# Patient Record
Sex: Female | Born: 1945 | Race: White | Hispanic: No | Marital: Married | Smoking: Former smoker
Health system: Southern US, Community
[De-identification: ages and names within clinical notes are randomized; demographics above are authoritative.]

## PROBLEM LIST (undated history)

## (undated) DIAGNOSIS — J45909 Unspecified asthma, uncomplicated: Secondary | ICD-10-CM

## (undated) DIAGNOSIS — E7800 Pure hypercholesterolemia, unspecified: Secondary | ICD-10-CM

## (undated) DIAGNOSIS — M5136 Other intervertebral disc degeneration, lumbar region: Secondary | ICD-10-CM

## (undated) DIAGNOSIS — M1990 Unspecified osteoarthritis, unspecified site: Secondary | ICD-10-CM

## (undated) DIAGNOSIS — I10 Essential (primary) hypertension: Secondary | ICD-10-CM

## (undated) DIAGNOSIS — J40 Bronchitis, not specified as acute or chronic: Secondary | ICD-10-CM

## (undated) DIAGNOSIS — G4730 Sleep apnea, unspecified: Secondary | ICD-10-CM

## (undated) DIAGNOSIS — E119 Type 2 diabetes mellitus without complications: Secondary | ICD-10-CM

## (undated) DIAGNOSIS — D759 Disease of blood and blood-forming organs, unspecified: Secondary | ICD-10-CM

## (undated) DIAGNOSIS — E039 Hypothyroidism, unspecified: Secondary | ICD-10-CM

## (undated) DIAGNOSIS — K219 Gastro-esophageal reflux disease without esophagitis: Secondary | ICD-10-CM

## (undated) DIAGNOSIS — IMO0001 Reserved for inherently not codable concepts without codable children: Secondary | ICD-10-CM

## (undated) DIAGNOSIS — M7072 Other bursitis of hip, left hip: Secondary | ICD-10-CM

## (undated) DIAGNOSIS — M4310 Spondylolisthesis, site unspecified: Secondary | ICD-10-CM

## (undated) HISTORY — PX: OTHER SURGICAL HISTORY: SHX169

## (undated) HISTORY — PX: APPENDECTOMY: SHX54

## (undated) HISTORY — PX: ABDOMINAL HYSTERECTOMY: SHX81

## (undated) HISTORY — PX: ROTATOR CUFF REPAIR: SHX139

## (undated) HISTORY — PX: BREAST SURGERY: SHX581

## (undated) HISTORY — PX: DILATION AND CURETTAGE OF UTERUS: SHX78

---

## 2013-07-15 DEATH — deceased

## 2014-11-15 NOTE — Progress Notes (Signed)
Please put orders in Epic surgery 12-01-14 pre op 11-22-14 Thanks

## 2014-11-18 ENCOUNTER — Ambulatory Visit: Payer: Self-pay | Admitting: Orthopedic Surgery

## 2014-11-18 NOTE — Progress Notes (Signed)
Preoperative surgical orders have been place into the Epic hospital system for Michaela Morris on 11/18/2014, 1:48 PM  by Patrica DuelPERKINS, Eliani Leclere for surgery on 12/01/2014.  Preop Total Hip - Anterior Approach orders including Experel Injecion, IV Tylenol, and IV Decadron as long as there are no contraindications to the above medications. Michaela Peacerew [name], PA-C

## 2014-11-22 ENCOUNTER — Inpatient Hospital Stay (HOSPITAL_COMMUNITY): Admission: RE | Admit: 2014-11-22 | Payer: Medicare Other | Source: Ambulatory Visit

## 2014-11-22 ENCOUNTER — Other Ambulatory Visit (HOSPITAL_COMMUNITY): Payer: Self-pay | Admitting: *Deleted

## 2014-11-23 ENCOUNTER — Encounter (HOSPITAL_COMMUNITY)
Admission: RE | Admit: 2014-11-23 | Discharge: 2014-11-23 | Disposition: A | Discharge status: Home / Self Care | Payer: Medicare Other | Source: Ambulatory Visit | Attending: Orthopedic Surgery | Admitting: Orthopedic Surgery

## 2014-11-23 ENCOUNTER — Encounter (HOSPITAL_COMMUNITY): Payer: Self-pay

## 2014-11-23 ENCOUNTER — Ambulatory Visit (HOSPITAL_COMMUNITY)
Admission: RE | Admit: 2014-11-23 | Discharge: 2014-11-23 | Disposition: A | Discharge status: Home / Self Care | Payer: Medicare Other | Source: Ambulatory Visit | Attending: Anesthesiology | Admitting: Anesthesiology

## 2014-11-23 DIAGNOSIS — E780 Pure hypercholesterolemia: Secondary | ICD-10-CM | POA: Insufficient documentation

## 2014-11-23 DIAGNOSIS — M1612 Unilateral primary osteoarthritis, left hip: Secondary | ICD-10-CM | POA: Diagnosis not present

## 2014-11-23 DIAGNOSIS — J45909 Unspecified asthma, uncomplicated: Secondary | ICD-10-CM | POA: Diagnosis not present

## 2014-11-23 DIAGNOSIS — Z01812 Encounter for preprocedural laboratory examination: Secondary | ICD-10-CM | POA: Insufficient documentation

## 2014-11-23 DIAGNOSIS — G4730 Sleep apnea, unspecified: Secondary | ICD-10-CM | POA: Insufficient documentation

## 2014-11-23 DIAGNOSIS — Z87891 Personal history of nicotine dependence: Secondary | ICD-10-CM | POA: Diagnosis not present

## 2014-11-23 DIAGNOSIS — K219 Gastro-esophageal reflux disease without esophagitis: Secondary | ICD-10-CM | POA: Diagnosis not present

## 2014-11-23 DIAGNOSIS — E039 Hypothyroidism, unspecified: Secondary | ICD-10-CM | POA: Insufficient documentation

## 2014-11-23 DIAGNOSIS — Z01818 Encounter for other preprocedural examination: Secondary | ICD-10-CM | POA: Diagnosis present

## 2014-11-23 HISTORY — DX: Unspecified asthma, uncomplicated: J45.909

## 2014-11-23 HISTORY — DX: Unspecified osteoarthritis, unspecified site: M19.90

## 2014-11-23 HISTORY — DX: Pure hypercholesterolemia, unspecified: E78.00

## 2014-11-23 HISTORY — DX: Sleep apnea, unspecified: G47.30

## 2014-11-23 HISTORY — DX: Reserved for inherently not codable concepts without codable children: IMO0001

## 2014-11-23 HISTORY — DX: Other bursitis of hip, left hip: M70.72

## 2014-11-23 HISTORY — DX: Hypothyroidism, unspecified: E03.9

## 2014-11-23 HISTORY — DX: Spondylolisthesis, site unspecified: M43.10

## 2014-11-23 HISTORY — DX: Gastro-esophageal reflux disease without esophagitis: K21.9

## 2014-11-23 HISTORY — DX: Other intervertebral disc degeneration, lumbar region: M51.36

## 2014-11-23 HISTORY — DX: Disease of blood and blood-forming organs, unspecified: D75.9

## 2014-11-23 HISTORY — DX: Bronchitis, not specified as acute or chronic: J40

## 2014-11-23 LAB — APTT: aPTT: 32 seconds (ref 24–37)

## 2014-11-23 LAB — URINALYSIS, ROUTINE W REFLEX MICROSCOPIC
BILIRUBIN URINE: NEGATIVE
Glucose, UA: NEGATIVE mg/dL
Hgb urine dipstick: NEGATIVE
KETONES UR: NEGATIVE mg/dL
LEUKOCYTES UA: NEGATIVE
NITRITE: NEGATIVE
PH: 6 (ref 5.0–8.0)
Protein, ur: NEGATIVE mg/dL
Specific Gravity, Urine: 1.009 (ref 1.005–1.030)
UROBILINOGEN UA: 1 mg/dL (ref 0.0–1.0)

## 2014-11-23 LAB — PROTIME-INR
INR: 0.89 (ref 0.00–1.49)
Prothrombin Time: 12.2 seconds (ref 11.6–15.2)

## 2014-11-23 LAB — SURGICAL PCR SCREEN
MRSA, PCR: NEGATIVE
STAPHYLOCOCCUS AUREUS: NEGATIVE

## 2014-11-23 NOTE — Progress Notes (Addendum)
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

## 2014-11-23 NOTE — Patient Instructions (Signed)
[address]inda Morris  11/23/2014   Your procedure is scheduled on:     Wednesday December 01, 2014   [facility] and follow signs to  Short Stay Center arrive at 0630 AM.   Call this number if you have problems the morning of surgery [phone] or Presurgical Testing [phone].   Remember:  Do not eat food or drink liquids :After Midnight.               For Cpap use: bring mask and tubing only.     Take these medicines the morning of surgery with A SIP OF WATER: Nexium if needed;Flonase if needed;Levothyroxine                                You may not have any metal on your body including hair pins and piercings  Do not wear jewelry, make-up, lotions, powders,prefumes or deodorant.  Do not shave body hair  48 hours(2 days) of CHG soap use.                Do not bring valuables to the hospital. [employer] IS NOT RESPONSIBLE FOR VALUABLES.  Contacts, dentures or bridgework may not be worn into surgery.  Leave suitcase in the car. After surgery it may be brought to your room.  For patients admitted to the hospital, checkout time is 11:00 AM the day of discharge.     Special Instructions: review fact sheets for MRSA information, Blood Transfusion fact sheet, Incentive Spirometry.   ________________________________________________________________________  [facility] - Preparing for [facility] Before surgery, you can play an important role.  Because skin is not sterile, your skin needs to be as free of germs as possible.  You can reduce the number of germs on your skin by washing with CHG (chlorahexidine gluconate) soap before surgery.  CHG is an antiseptic cleaner which kills germs and bonds with the skin to continue killing germs even after washing. Please DO NOT use if you have an allergy to CHG or antibacterial soaps.  If your skin becomes reddened/irritated stop using the CHG and inform your nurse when you arrive at Short Stay. Do not shave (including  legs and underarms) for at least 48 hours prior to the first CHG shower.  You may shave your face/neck. Please follow these instructions carefully:  1.  Shower with CHG Soap the night before surgery and the  morning of Surgery.  2.  If you choose to wash your hair, wash your hair first as usual with your  normal  shampoo.  3.  After you shampoo, rinse your hair and body thoroughly to remove the  shampoo.                           4.  Use CHG as you would any other liquid soap.  You can apply chg directly  to the skin and wash                       Gently with a scrungie or clean washcloth.  5.  Apply the CHG Soap to your body ONLY FROM THE NECK DOWN.   Do not use on face/ open                           Wound or open sores.  Avoid contact with eyes, ears mouth and genitals (private parts).                       Wash face,  Genitals (private parts) with your normal soap.             6.  Wash thoroughly, paying special attention to the area where your surgery  will be performed.  7.  Thoroughly rinse your body with warm water from the neck down.  8.  DO NOT shower/wash with your normal soap after using and rinsing off  the CHG Soap.                9.  Pat yourself dry with a clean towel.            10.  Wear clean pajamas.            11.  Place clean sheets on your bed the night of your first shower and do not  sleep with pets. Day of Surgery : Do not apply any lotions/deodorants the morning of surgery.  Please wear clean clothes to the hospital/surgery center.  FAILURE TO FOLLOW THESE INSTRUCTIONS MAY RESULT IN THE CANCELLATION OF YOUR SURGERY PATIENT SIGNATURE_________________________________  NURSE SIGNATURE__________________________________  ________________________________________________________________________   Michaela Morris  An incentive spirometer is a tool that can help keep your lungs clear and active. This tool measures how well you are filling your lungs with each breath.  Taking long deep breaths may help reverse or decrease the chance of developing breathing (pulmonary) problems (especially infection) following:  A long period of time when you are unable to move or be active. BEFORE THE PROCEDURE   If the spirometer includes an indicator to show your best effort, your nurse or respiratory therapist will set it to a desired goal.  If possible, sit up straight or lean slightly forward. Try not to slouch.  Hold the incentive spirometer in an upright position. INSTRUCTIONS FOR USE   Sit on the edge of your bed if possible, or sit up as far as you can in bed or on a chair.  Hold the incentive spirometer in an upright position.  Breathe out normally.  Place the mouthpiece in your mouth and seal your lips tightly around it.  Breathe in slowly and as deeply as possible, raising the piston or the ball toward the top of the column.  Hold your breath for 3-5 seconds or for as long as possible. Allow the piston or ball to fall to the bottom of the column.  Remove the mouthpiece from your mouth and breathe out normally.  Rest for a few seconds and repeat Steps 1 through 7 at least 10 times every 1-2 hours when you are awake. Take your time and take a few normal breaths between deep breaths.  The spirometer may include an indicator to show your best effort. Use the indicator as a goal to work toward during each repetition.  After each set of 10 deep breaths, practice coughing to be sure your lungs are clear. If you have an incision (the cut made at the time of surgery), support your incision when coughing by placing a pillow or rolled up towels firmly against it. Once you are able to get out of bed, walk around indoors and cough well. You may stop using the incentive spirometer when instructed by your caregiver.  RISKS AND COMPLICATIONS  Take your time so you do not get dizzy or light-headed.  If you are in pain, you may need to take or ask for pain medication  before doing incentive spirometry. It is harder to take a deep breath if you are having pain. AFTER USE  Rest and breathe slowly and easily.  It can be helpful to keep track of a log of your progress. Your caregiver can provide you with a simple table to help with this. If you are using the spirometer at home, follow these instructions: SEEK MEDICAL CARE IF:   You are having difficultly using the spirometer.  You have trouble using the spirometer as often as instructed.  Your pain medication is not giving enough relief while using the spirometer.  You develop fever of 100.5 F (38.1 C) or higher. SEEK IMMEDIATE MEDICAL CARE IF:   You cough up bloody sputum that had not been present before.  You develop fever of 102 F (38.9 C) or greater.  You develop worsening pain at or near the incision site. MAKE SURE YOU:   Understand these instructions.  Will watch your condition.  Will get help right away if you are not doing well or get worse. Document Released: 02/11/2007 Document Revised: 12/24/2011 Document Reviewed: 04/14/2007 ExitCare Patient Information 2014 ExitCare, [place].   ________________________________________________________________________  WHAT IS A BLOOD TRANSFUSION? Blood Transfusion Information  A transfusion is the replacement of blood or some of its parts. Blood is made up of multiple cells which provide different functions.  Red blood cells carry oxygen and are used for blood loss replacement.  White blood cells fight against infection.  Platelets control bleeding.  Plasma helps clot blood.  Other blood products are available for specialized needs, such as hemophilia or other clotting disorders. BEFORE THE TRANSFUSION  Who gives blood for transfusions?   Healthy volunteers who are fully evaluated to make sure their blood is safe. This is blood bank blood. Transfusion therapy is the safest it has ever been in the practice of medicine. Before blood is  taken from a donor, a complete history is taken to make sure that person has no history of diseases nor engages in risky social behavior (examples are intravenous drug use or sexual activity with multiple partners). The donor's travel history is screened to minimize risk of transmitting infections, such as malaria. The donated blood is tested for signs of infectious diseases, such as HIV and hepatitis. The blood is then tested to be sure it is compatible with you in order to minimize the chance of a transfusion reaction. If you or a relative donates blood, this is often done in anticipation of surgery and is not appropriate for emergency situations. It takes many days to process the donated blood. RISKS AND COMPLICATIONS Although transfusion therapy is very safe and saves many lives, the main dangers of transfusion include:   Getting an infectious disease.  Developing a transfusion reaction. This is an allergic reaction to something in the blood you were given. Every precaution is taken to prevent this. The decision to have a blood transfusion has been considered carefully by your caregiver before blood is given. Blood is not given unless the benefits outweigh the risks. AFTER THE TRANSFUSION  Right after receiving a blood transfusion, you will usually feel much better and more energetic. This is especially true if your red blood cells have gotten low (anemic). The transfusion raises the level of the red blood cells which carry oxygen, and this usually causes an energy increase.  The nurse administering the transfusion will monitor you carefully for  complications. HOME CARE INSTRUCTIONS  No special instructions are needed after a transfusion. You may find your energy is better. Speak with your caregiver about any limitations on activity for underlying diseases you may have. SEEK MEDICAL CARE IF:   Your condition is not improving after your transfusion.  You develop redness or irritation at the  intravenous (IV) site. SEEK IMMEDIATE MEDICAL CARE IF:  Any of the following symptoms occur over the next 12 hours:  Shaking chills.  You have a temperature by mouth above 102 F (38.9 C), not controlled by medicine.  Chest, back, or muscle pain.  People around you feel you are not acting correctly or are confused.  Shortness of breath or difficulty breathing.  Dizziness and fainting.  You get a rash or develop hives.  You have a decrease in urine output.  Your urine turns a dark color or changes to pink, red, or brown. Any of the following symptoms occur over the next 10 days:  You have a temperature by mouth above 102 F (38.9 C), not controlled by medicine.  Shortness of breath.  Weakness after normal activity.  The white part of the eye turns yellow (jaundice).  You have a decrease in the amount of urine or are urinating less often.  Your urine turns a dark color or changes to pink, red, or brown. Document Released: 09/28/2000 Document Revised: 12/24/2011 Document Reviewed: 05/17/2008 [facility] Patient Information 2014 [place], [place].  _______________________________________________________________________

## 2014-11-23 NOTE — Progress Notes (Signed)
During PAT visit 11/23/2014 pt states has been speaking with her sister in law whom is a PA an has also been on internet and strongly expessed need for Factor 9 to be drawn. Pt also gave info on hemophilia and requested for this to be placed on chart. RN did make copy but through further investigation noted the info had someone else name on paperwork so RN discarded. PT stated "if she did not have drawn and bled out during surgery we would be responsible" discussed with pt that if could not obtain order would not be able to draw extra blood work just in case order was obtained. This RN felt need to discuss situation with anesthesia/Dr Acey Lavarignan. This RN also notified Publishing rights managerTerri Sharpe manager of PAT.

## 2014-11-24 LAB — FACTOR 9 ASSAY: Coagulation Factor IX: 86 % (ref 75–134)

## 2014-11-24 NOTE — Progress Notes (Signed)
Factor 9 final result faxed to dr Lequita Haltaluisio fax [phone] by epic

## 2014-11-30 ENCOUNTER — Ambulatory Visit: Payer: Self-pay | Admitting: Orthopedic Surgery

## 2014-11-30 NOTE — Anesthesia Preprocedure Evaluation (Addendum)
Anesthesia Evaluation  Patient identified by MRN, date of birth, ID band Patient awake    Reviewed: Allergy & Precautions, NPO status , Patient's Chart, lab work & pertinent test results  Airway Mallampati: II  TM Distance: >3 FB Neck ROM: Full    Dental no notable dental hx.    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, Factor IX activity WNL   Anesthesia Other Findings   Reproductive/Obstetrics negative OB ROS                         Anesthesia Physical Anesthesia Plan  ASA: III  Anesthesia Plan: General   Post-op Pain Management:    Induction: Intravenous  Airway Management Planned: Oral ETT  Additional Equipment: None  Intra-op Plan:   Post-operative Plan: Extubation in OR  Informed Consent: I have reviewed the patients History and Physical, chart, labs and discussed the procedure including the risks, benefits and alternatives for the proposed anesthesia with the patient or authorized representative who has indicated his/her understanding and acceptance.   Dental advisory given  Plan Discussed with: CRNA  Anesthesia Plan Comments:        Anesthesia Quick Evaluation

## 2014-11-30 NOTE — H&P (Signed)
Michaela Morris DOB: 04-22-1946 Married / Language: English / Race: White Female Date of Admission: 12/01/2014 CC:  Left Hip Pain History of Present Illness  The patient is a 69 year old female who comes in  for a preoperative History and Physical. The patient is scheduled for a total hip arthroplasty (anterior approach) to be performed by Dr. Gus Rankin. Aluisio, MD at [facility] on 12-01-2014. The patient is a 69 year old female being followed for their left hip pain and osteoarthritis. They are several months out from intra-articular injection with Dr. Ethelene Hal. Symptoms reported include hip pain and aching and report their pain level to be mild to moderate. Current treatment includes: NSAIDs. The patient has reported some improvement of their symptoms with: Cortisone injections. She could not tell a big difference following the shot except for a big reduction in the pain at night. She still has hip pain which she describes as mostly located on the side of the hip. It has radiated down the side of the thigh toward the knee and sometimes onto the top of the thigh. She states the lateral hip shot she received a while back seemed to help more that this most recent shot. She has several issues ongoing. She has been diagnosed with advanced arthritis on the left hip and also with the bursitis on that side. She also describes numnbess that she will get in the right thigh area if she stands for too long in one space. She states that she has had back issues for years and was told that she has a spondylolisthesis with some stenosis. The left hip is what is bothering her the most. The intra-articular injection helped for a short amount of time. She already has recurrent pain. She also has pain in her left knee. Her back does bother her too. The most debilitating thing for her is the left hip. She has a very hard time walking and getting around. She would like to go ahead and proceed with hip replacement  at this time. They have been treated conservatively in the past for the above stated problem and despite conservative measures, they continue to have progressive pain and severe functional limitations and dysfunction. They have failed non-operative management including home exercise, medications, and injections. It is felt that they would benefit from undergoing total joint replacement. Risks and benefits of the procedure have been discussed with the patient and they elect to proceed with surgery. There are no active contraindications to surgery such as ongoing infection or rapidly progressive neurological disease.  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 Diabetes Mellitus Father. First Degree Relatives reported Rheumatoid Arthritis Maternal Grandfather. Chronic Obstructive Lung Disease brother  Social History Current drinker 04/21/2014: Currently drinks wine only occasionally per week Tobacco use 04/21/2014 former smoker; smoke(d) less than 1/2 pack(s) per day Tobacco /  smoke exposure 04/21/2014:  no no Not under pain contract No history of drug/alcohol rehab Living situation live alone Illicit drug use no Exercise Exercises weekly; does other Exercises daily; does other Pain Contract no Number of flights of stairs before winded less than 1 1 Marital status married Children 3 Alcohol use current drinker; drinks wine; only occasionally per week Drug/Alcohol Rehab (Previously) no Drug/Alcohol Rehab (Currently) no 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 Aspirin EC (  Tablet DR, Oral) Active. Vitamin D3 (2000UNIT Capsule, Oral) Active. (BID) Co Q-10 (Oral) Specific dose unknown - Active. Estradiol (  Tablet, Oral) Active. Levothyroxine Sodium ( Tablet, Oral) Active. Montelukast Sodium (  Tablet, Oral) Active. (Singulair) NexIUM (  Capsule DR, Oral) Active. Tylenol (  Capsule, 1 (one) Oral) Active. Pravastatin Sodium (  Tablet, Oral) Active. CeleBREX (  Capsule, Oral) Active. Desonide (0.05% Cream, External) Active. Fluticasone Propionate (Inhal) (50MCG/BLIST Aero Pow Br Act, Inhalation) Active.  Past Surgical History  Colon Polyp Removal - Colonoscopy Breast Biopsy left Arthroscopy of Shoulder right Rotator Cuff Repair right Hysterectomy complete (non-cancerous) Dilation and Curettage of Uterus  Review of Systems General Not Present- Chills, Fatigue, Fever, Memory Loss, Night Sweats, Weight Gain and Weight Loss. Skin Not Present- Eczema, Hives, Itching, Lesions and Rash. HEENT Not Present- Dentures, Double Vision, Headache, Hearing Loss, Tinnitus and Visual Loss. Respiratory Not Present- Allergies, Chronic Cough, Coughing up blood, Shortness of breath at rest and Shortness of breath with exertion. Cardiovascular  Not Present- Chest Pain, Difficulty Breathing Lying Down, Murmur, Palpitations, Racing/skipping heartbeats and Swelling. Gastrointestinal Not Present- Abdominal Pain, Bloody Stool, Constipation, Diarrhea, Difficulty Swallowing, Heartburn, Jaundice, Loss of appetitie, Nausea and Vomiting. Female Genitourinary Present- Urinary frequency. Not Present- Blood in Urine, Discharge, Flank Pain, Incontinence, Painful Urination, Urgency, Urinary Retention, Urinating at Night and Weak urinary stream. Musculoskeletal Present- Joint Pain and Morning Stiffness. Not Present- Back Pain, Joint Swelling, Muscle Pain, Muscle Weakness and Spasms. Neurological Not Present- Blackout spells, Difficulty with balance, Dizziness, Paralysis, Tremor and Weakness. Psychiatric Not Present- Insomnia.  Vitals Weight: 223 lb Height: 62in Weight was reported by patient. Height was reported by patient. Body Surface Area: 2 m Body Mass Index: 40.79 kg/m  BP: 132/82 (Sitting, Right Arm, Standard)   Physical Exam  General Mental Status -Alert, cooperative and good historian. General Appearance-pleasant, Not in acute distress. Orientation-Oriented X3. Build & Nutrition-Well nourished and Well developed.  Head and Neck Head-normocephalic, atraumatic . Neck Global Assessment - supple, no bruit auscultated on the right, no bruit auscultated on the left.  Eye Pupil - Bilateral-Regular and Round. Motion - Bilateral-EOMI.  Chest and Lung Exam Auscultation Breath sounds - clear at anterior chest wall and clear at posterior chest wall. Adventitious sounds - No Adventitious sounds.  Cardiovascular Auscultation Rhythm - Regular rate and rhythm. Heart Sounds - S1 WNL and S2 WNL. Murmurs & Other Heart Sounds - Auscultation of the heart reveals - No Murmurs.  Abdomen Palpation/Percussion Tenderness - Abdomen is non-tender to palpation. Rigidity (guarding) - Abdomen is soft. Auscultation Auscultation of  the abdomen reveals - Bowel sounds normal.  Female Genitourinary Note: Not done, not pertinent to present illness   Musculoskeletal She is alert and oriented. No apparent distress. The left hip can be flexed to about 100. No internal or external rotation. She has only about 10 degrees of abduction. The left knee shows moderate crepitus on range of motion. Range  is about 5-125. She is tender medial greater than lateral with no instability noted. Pulse, sensation and motor are intact in both lower extremities.   RADIOGRAPHS: AP pelvis and lateral of her left hip shows severe end stage arthritis left hip, bone on bone throughout. Her left knee also shows arthritic change, medial and patellofemoral.  Assessment & Plan  Primary osteoarthritis of left hip (M16.12) Note:Surgical Plans: Left Total Hip Repalcement - Anterior Approach  [place] [facility] (Her husband is currently there as a resident).  PCP: Dr. Shanda BumpsModupeola Abiodun - Her preop clearance appointment is pending at the time of the H&P.  Topical TXA  Anesthesia Issues: None  Signed electronically by Lauraine RinneAlexzandrew L Jullian Clayson, III PA-C

## 2014-12-01 ENCOUNTER — Other Ambulatory Visit: Payer: Self-pay | Admitting: Orthopedic Surgery

## 2014-12-01 ENCOUNTER — Inpatient Hospital Stay (HOSPITAL_COMMUNITY): Payer: Medicare Other | Admitting: Anesthesiology

## 2014-12-01 ENCOUNTER — Encounter (HOSPITAL_COMMUNITY): Payer: Self-pay | Admitting: General Practice

## 2014-12-01 ENCOUNTER — Inpatient Hospital Stay (HOSPITAL_COMMUNITY): Payer: Medicare Other

## 2014-12-01 ENCOUNTER — Encounter (HOSPITAL_COMMUNITY)
Admission: RE | Disposition: A | Discharge status: Home Health | Payer: Self-pay | Source: Ambulatory Visit | Attending: Orthopedic Surgery

## 2014-12-01 ENCOUNTER — Inpatient Hospital Stay (HOSPITAL_COMMUNITY)
Admission: RE | Admit: 2014-12-01 | Discharge: 2014-12-03 | DRG: 469 | Disposition: A | Discharge status: Home Health | Payer: Medicare Other | Source: Ambulatory Visit | Attending: Orthopedic Surgery | Admitting: Orthopedic Surgery

## 2014-12-01 DIAGNOSIS — E780 Pure hypercholesterolemia: Secondary | ICD-10-CM | POA: Diagnosis present

## 2014-12-01 DIAGNOSIS — J45909 Unspecified asthma, uncomplicated: Secondary | ICD-10-CM | POA: Diagnosis present

## 2014-12-01 DIAGNOSIS — M71552 Other bursitis, not elsewhere classified, left hip: Secondary | ICD-10-CM | POA: Diagnosis present

## 2014-12-01 DIAGNOSIS — Z7982 Long term (current) use of aspirin: Secondary | ICD-10-CM | POA: Diagnosis not present

## 2014-12-01 DIAGNOSIS — E039 Hypothyroidism, unspecified: Secondary | ICD-10-CM | POA: Diagnosis present

## 2014-12-01 DIAGNOSIS — Z79899 Other long term (current) drug therapy: Secondary | ICD-10-CM | POA: Diagnosis not present

## 2014-12-01 DIAGNOSIS — K219 Gastro-esophageal reflux disease without esophagitis: Secondary | ICD-10-CM | POA: Diagnosis present

## 2014-12-01 DIAGNOSIS — Z881 Allergy status to other antibiotic agents status: Secondary | ICD-10-CM

## 2014-12-01 DIAGNOSIS — M1612 Unilateral primary osteoarthritis, left hip: Secondary | ICD-10-CM | POA: Diagnosis present

## 2014-12-01 DIAGNOSIS — M4310 Spondylolisthesis, site unspecified: Secondary | ICD-10-CM | POA: Diagnosis present

## 2014-12-01 DIAGNOSIS — D67 Hereditary factor IX deficiency: Secondary | ICD-10-CM | POA: Diagnosis not present

## 2014-12-01 DIAGNOSIS — G4730 Sleep apnea, unspecified: Secondary | ICD-10-CM | POA: Diagnosis present

## 2014-12-01 DIAGNOSIS — M1712 Unilateral primary osteoarthritis, left knee: Secondary | ICD-10-CM | POA: Diagnosis present

## 2014-12-01 DIAGNOSIS — Z87891 Personal history of nicotine dependence: Secondary | ICD-10-CM | POA: Diagnosis not present

## 2014-12-01 DIAGNOSIS — M5136 Other intervertebral disc degeneration, lumbar region: Secondary | ICD-10-CM | POA: Diagnosis not present

## 2014-12-01 DIAGNOSIS — Z90710 Acquired absence of both cervix and uterus: Secondary | ICD-10-CM

## 2014-12-01 DIAGNOSIS — Z96649 Presence of unspecified artificial hip joint: Secondary | ICD-10-CM

## 2014-12-01 DIAGNOSIS — Z86010 Personal history of colonic polyps: Secondary | ICD-10-CM

## 2014-12-01 DIAGNOSIS — M169 Osteoarthritis of hip, unspecified: Secondary | ICD-10-CM | POA: Diagnosis present

## 2014-12-01 HISTORY — PX: TOTAL HIP ARTHROPLASTY: SHX124

## 2014-12-01 LAB — TYPE AND SCREEN
ABO/RH(D): O POS
Antibody Screen: NEGATIVE

## 2014-12-01 SURGERY — ARTHROPLASTY, HIP, TOTAL, ANTERIOR APPROACH
Anesthesia: General | Site: Hip | Laterality: Left

## 2014-12-01 MED ORDER — SUCCINYLCHOLINE CHLORIDE 20 MG/ML IJ SOLN
INTRAMUSCULAR | Status: DC | PRN
  Administered 2014-12-01: 100 mg via INTRAVENOUS
  Administered 2014-12-01: 30 mg via INTRAVENOUS

## 2014-12-01 MED ORDER — OXYCODONE HCL 5 MG PO TABS
5.0000 mg | ORAL_TABLET | ORAL | Status: DC | PRN
  Administered 2014-12-01: 5 mg via ORAL
  Administered 2014-12-01 – 2014-12-02 (×2): 10 mg via ORAL
  Administered 2014-12-02 (×2): 5 mg via ORAL
  Administered 2014-12-02: 10 mg via ORAL
  Administered 2014-12-02 – 2014-12-03 (×4): 5 mg via ORAL
  Filled 2014-12-01 (×2): qty 1
  Filled 2014-12-01 (×2): qty 2
  Filled 2014-12-01: qty 1
  Filled 2014-12-01 (×3): qty 2
  Filled 2014-12-01: qty 1
  Filled 2014-12-01: qty 2
  Filled 2014-12-01: qty 1

## 2014-12-01 MED ORDER — HYDROMORPHONE HCL 2 MG/ML IJ SOLN
INTRAMUSCULAR | Status: AC
  Filled 2014-12-01: qty 1

## 2014-12-01 MED ORDER — MIDAZOLAM HCL 2 MG/2ML IJ SOLN
INTRAMUSCULAR | Status: AC
  Filled 2014-12-01: qty 2

## 2014-12-01 MED ORDER — METHOCARBAMOL 1000 MG/10ML IJ SOLN
500.0000 mg | Freq: Four times a day (QID) | INTRAVENOUS | Status: DC | PRN
  Administered 2014-12-01: 500 mg via INTRAVENOUS
  Filled 2014-12-01 (×2): qty 5

## 2014-12-01 MED ORDER — FENTANYL CITRATE 0.05 MG/ML IJ SOLN
INTRAMUSCULAR | Status: DC | PRN
  Administered 2014-12-01: 50 ug via INTRAVENOUS
  Administered 2014-12-01: 100 ug via INTRAVENOUS

## 2014-12-01 MED ORDER — MONTELUKAST SODIUM 10 MG PO TABS
10.0000 mg | ORAL_TABLET | Freq: Every day | ORAL | Status: DC
  Administered 2014-12-01 – 2014-12-02 (×2): 10 mg via ORAL
  Filled 2014-12-01 (×3): qty 1

## 2014-12-01 MED ORDER — METOCLOPRAMIDE HCL 10 MG PO TABS: 5.0000 mg | ORAL_TABLET | Freq: Three times a day (TID) | ORAL | Status: DC | PRN

## 2014-12-01 MED ORDER — FLEET ENEMA 7-19 GM/118ML RE ENEM: 1.0000 | ENEMA | Freq: Once | RECTAL | Status: AC | PRN

## 2014-12-01 MED ORDER — ONDANSETRON HCL 4 MG/2ML IJ SOLN
INTRAMUSCULAR | Status: AC
  Filled 2014-12-01: qty 2

## 2014-12-01 MED ORDER — SODIUM CHLORIDE 0.9 % IV SOLN
INTRAVENOUS | Status: DC
  Administered 2014-12-01 – 2014-12-02 (×2): via INTRAVENOUS

## 2014-12-01 MED ORDER — MORPHINE SULFATE 2 MG/ML IJ SOLN
1.0000 mg | INTRAMUSCULAR | Status: DC | PRN
  Administered 2014-12-01: 1 mg via INTRAVENOUS
  Filled 2014-12-01: qty 1

## 2014-12-01 MED ORDER — ACETAMINOPHEN 10 MG/ML IV SOLN
1000.0000 mg | Freq: Once | INTRAVENOUS | Status: AC
  Filled 2014-12-01: qty 100

## 2014-12-01 MED ORDER — ACETAMINOPHEN 325 MG PO TABS
650.0000 mg | ORAL_TABLET | Freq: Four times a day (QID) | ORAL | Status: DC | PRN
  Administered 2014-12-02 – 2014-12-03 (×3): 650 mg via ORAL
  Filled 2014-12-01 (×3): qty 2

## 2014-12-01 MED ORDER — LIDOCAINE HCL (CARDIAC) 20 MG/ML IV SOLN
INTRAVENOUS | Status: DC | PRN
  Administered 2014-12-01: 100 mg via INTRAVENOUS

## 2014-12-01 MED ORDER — ONDANSETRON HCL 4 MG/2ML IJ SOLN
INTRAMUSCULAR | Status: DC | PRN
  Administered 2014-12-01: 4 mg via INTRAVENOUS

## 2014-12-01 MED ORDER — ATROPINE SULFATE 0.4 MG/ML IJ SOLN
INTRAMUSCULAR | Status: AC
  Filled 2014-12-01: qty 2

## 2014-12-01 MED ORDER — DEXAMETHASONE SODIUM PHOSPHATE 10 MG/ML IJ SOLN
INTRAMUSCULAR | Status: AC
  Filled 2014-12-01: qty 1

## 2014-12-01 MED ORDER — ACETAMINOPHEN 10 MG/ML IV SOLN
1000.0000 mg | Freq: Once | INTRAVENOUS | Status: AC
  Administered 2014-12-01: 1000 mg via INTRAVENOUS
  Filled 2014-12-01: qty 100

## 2014-12-01 MED ORDER — PROPOFOL 10 MG/ML IV BOLUS
INTRAVENOUS | Status: DC | PRN
  Administered 2014-12-01: 200 mg via INTRAVENOUS

## 2014-12-01 MED ORDER — CHLORHEXIDINE GLUCONATE 4 % EX LIQD
60.0000 mL | Freq: Once | CUTANEOUS | Status: DC
  Administered 2014-12-01: 4 via TOPICAL

## 2014-12-01 MED ORDER — GLYCOPYRROLATE 0.2 MG/ML IJ SOLN
INTRAMUSCULAR | Status: AC
  Filled 2014-12-01: qty 1

## 2014-12-01 MED ORDER — LIDOCAINE HCL (CARDIAC) 20 MG/ML IV SOLN
INTRAVENOUS | Status: AC
  Filled 2014-12-01: qty 5

## 2014-12-01 MED ORDER — DEXAMETHASONE SODIUM PHOSPHATE 10 MG/ML IJ SOLN: 10.0000 mg | Freq: Once | INTRAMUSCULAR | Status: DC

## 2014-12-01 MED ORDER — CEFAZOLIN SODIUM-DEXTROSE 2-3 GM-% IV SOLR
2.0000 g | Freq: Four times a day (QID) | INTRAVENOUS | Status: AC
  Administered 2014-12-01 (×2): 2 g via INTRAVENOUS
  Filled 2014-12-01 (×2): qty 50

## 2014-12-01 MED ORDER — KETOROLAC TROMETHAMINE 15 MG/ML IJ SOLN: 7.5000 mg | Freq: Four times a day (QID) | INTRAMUSCULAR | Status: AC | PRN

## 2014-12-01 MED ORDER — EPHEDRINE SULFATE 50 MG/ML IJ SOLN
INTRAMUSCULAR | Status: AC
  Filled 2014-12-01: qty 1

## 2014-12-01 MED ORDER — FENTANYL CITRATE 0.05 MG/ML IJ SOLN
INTRAMUSCULAR | Status: AC
  Filled 2014-12-01: qty 2

## 2014-12-01 MED ORDER — NEOSTIGMINE METHYLSULFATE 10 MG/10ML IV SOLN
INTRAVENOUS | Status: DC | PRN
Start: 2014-12-01 — End: 2014-12-01
  Administered 2014-12-01: 5 mg via INTRAVENOUS

## 2014-12-01 MED ORDER — PROPOFOL 10 MG/ML IV BOLUS
INTRAVENOUS | Status: AC
  Filled 2014-12-01: qty 20

## 2014-12-01 MED ORDER — GLYCOPYRROLATE 0.2 MG/ML IJ SOLN
INTRAMUSCULAR | Status: AC
  Filled 2014-12-01: qty 3

## 2014-12-01 MED ORDER — POLYETHYLENE GLYCOL 3350 17 G PO PACK: 17.0000 g | PACK | Freq: Every day | ORAL | Status: DC | PRN

## 2014-12-01 MED ORDER — BUPIVACAINE HCL (PF) 0.25 % IJ SOLN
INTRAMUSCULAR | Status: AC
  Filled 2014-12-01: qty 30

## 2014-12-01 MED ORDER — CEFAZOLIN SODIUM-DEXTROSE 2-3 GM-% IV SOLR
INTRAVENOUS | Status: AC
  Filled 2014-12-01: qty 50

## 2014-12-01 MED ORDER — ONDANSETRON HCL 4 MG PO TABS: 4.0000 mg | ORAL_TABLET | Freq: Four times a day (QID) | ORAL | Status: DC | PRN

## 2014-12-01 MED ORDER — SODIUM CHLORIDE 0.9 % IJ SOLN
INTRAMUSCULAR | Status: DC | PRN
  Administered 2014-12-01: 50 mL via INTRAVENOUS

## 2014-12-01 MED ORDER — ACETAMINOPHEN 500 MG PO TABS
1000.0000 mg | ORAL_TABLET | Freq: Four times a day (QID) | ORAL | Status: AC
  Administered 2014-12-01 – 2014-12-02 (×4): 1000 mg via ORAL
  Filled 2014-12-01 (×4): qty 2

## 2014-12-01 MED ORDER — SODIUM CHLORIDE 0.9 % IV SOLN: INTRAVENOUS | Status: DC

## 2014-12-01 MED ORDER — BUPIVACAINE HCL (PF) 0.25 % IJ SOLN
INTRAMUSCULAR | Status: DC | PRN
  Administered 2014-12-01: 30 mL

## 2014-12-01 MED ORDER — LACTATED RINGERS IV SOLN
INTRAVENOUS | Status: DC | PRN
  Administered 2014-12-01: 08:00:00 via INTRAVENOUS

## 2014-12-01 MED ORDER — HYDROMORPHONE HCL 1 MG/ML IJ SOLN
0.2500 mg | INTRAMUSCULAR | Status: DC | PRN
  Administered 2014-12-01 (×3): 0.5 mg via INTRAVENOUS

## 2014-12-01 MED ORDER — GLYCOPYRROLATE 0.2 MG/ML IJ SOLN
INTRAMUSCULAR | Status: DC | PRN
  Administered 2014-12-01: .8 mg via INTRAVENOUS

## 2014-12-01 MED ORDER — HYDROMORPHONE HCL 1 MG/ML IJ SOLN
INTRAMUSCULAR | Status: AC
  Administered 2014-12-01: 0.5 mg via INTRAVENOUS
  Filled 2014-12-01: qty 1

## 2014-12-01 MED ORDER — METHOCARBAMOL 500 MG PO TABS
500.0000 mg | ORAL_TABLET | Freq: Four times a day (QID) | ORAL | Status: DC | PRN
  Administered 2014-12-02: 500 mg via ORAL
  Filled 2014-12-01: qty 1

## 2014-12-01 MED ORDER — SODIUM CHLORIDE 0.9 % IJ SOLN
INTRAMUSCULAR | Status: AC
  Filled 2014-12-01: qty 50

## 2014-12-01 MED ORDER — EPHEDRINE SULFATE 50 MG/ML IJ SOLN
INTRAMUSCULAR | Status: DC | PRN
  Administered 2014-12-01 (×3): 5 mg via INTRAVENOUS

## 2014-12-01 MED ORDER — MENTHOL 3 MG MT LOZG: 1.0000 | LOZENGE | OROMUCOSAL | Status: DC | PRN

## 2014-12-01 MED ORDER — PANTOPRAZOLE SODIUM 40 MG PO TBEC
80.0000 mg | DELAYED_RELEASE_TABLET | Freq: Every day | ORAL | Status: DC
  Filled 2014-12-01 (×3): qty 2

## 2014-12-01 MED ORDER — ROCURONIUM BROMIDE 100 MG/10ML IV SOLN
INTRAVENOUS | Status: AC
  Filled 2014-12-01: qty 1

## 2014-12-01 MED ORDER — CEFAZOLIN SODIUM-DEXTROSE 2-3 GM-% IV SOLR
2.0000 g | INTRAVENOUS | Status: AC
  Administered 2014-12-01: 2 g via INTRAVENOUS

## 2014-12-01 MED ORDER — DEXAMETHASONE SODIUM PHOSPHATE 10 MG/ML IJ SOLN
INTRAMUSCULAR | Status: DC | PRN
  Administered 2014-12-01: 10 mg via INTRAVENOUS

## 2014-12-01 MED ORDER — ACETAMINOPHEN 650 MG RE SUPP: 650.0000 mg | Freq: Four times a day (QID) | RECTAL | Status: DC | PRN

## 2014-12-01 MED ORDER — NEOSTIGMINE METHYLSULFATE 10 MG/10ML IV SOLN
INTRAVENOUS | Status: AC
  Filled 2014-12-01: qty 1

## 2014-12-01 MED ORDER — PHENOL 1.4 % MT LIQD: 1.0000 | OROMUCOSAL | Status: DC | PRN

## 2014-12-01 MED ORDER — LACTATED RINGERS IV SOLN
INTRAVENOUS | Status: DC
  Administered 2014-12-01: 12:00:00 via INTRAVENOUS

## 2014-12-01 MED ORDER — SODIUM CHLORIDE 0.9 % IJ SOLN
INTRAMUSCULAR | Status: AC
  Filled 2014-12-01: qty 10

## 2014-12-01 MED ORDER — ONDANSETRON HCL 4 MG/2ML IJ SOLN: 4.0000 mg | Freq: Four times a day (QID) | INTRAMUSCULAR | Status: DC | PRN

## 2014-12-01 MED ORDER — BISACODYL 10 MG RE SUPP: 10.0000 mg | Freq: Every day | RECTAL | Status: DC | PRN

## 2014-12-01 MED ORDER — MEPERIDINE HCL 50 MG/ML IJ SOLN: 6.2500 mg | INTRAMUSCULAR | Status: DC | PRN

## 2014-12-01 MED ORDER — HYDROMORPHONE HCL 1 MG/ML IJ SOLN
INTRAMUSCULAR | Status: DC | PRN
  Administered 2014-12-01: 1 mg via INTRAVENOUS
  Administered 2014-12-01 (×2): 0.5 mg via INTRAVENOUS

## 2014-12-01 MED ORDER — MIDAZOLAM HCL 5 MG/5ML IJ SOLN
INTRAMUSCULAR | Status: DC | PRN
  Administered 2014-12-01: 1 mg via INTRAVENOUS

## 2014-12-01 MED ORDER — BUPIVACAINE LIPOSOME 1.3 % IJ SUSP
INTRAMUSCULAR | Status: DC | PRN
  Administered 2014-12-01: 20 mL

## 2014-12-01 MED ORDER — ROCURONIUM BROMIDE 100 MG/10ML IV SOLN
INTRAVENOUS | Status: DC | PRN
  Administered 2014-12-01: 10 mg via INTRAVENOUS
  Administered 2014-12-01: 30 mg via INTRAVENOUS

## 2014-12-01 MED ORDER — DEXAMETHASONE SODIUM PHOSPHATE 10 MG/ML IJ SOLN
10.0000 mg | Freq: Once | INTRAMUSCULAR | Status: AC
  Administered 2014-12-02: 10 mg via INTRAVENOUS
  Filled 2014-12-01 (×2): qty 1

## 2014-12-01 MED ORDER — DOCUSATE SODIUM 100 MG PO CAPS
100.0000 mg | ORAL_CAPSULE | Freq: Two times a day (BID) | ORAL | Status: DC
  Administered 2014-12-01 – 2014-12-03 (×4): 100 mg via ORAL

## 2014-12-01 MED ORDER — RIVAROXABAN 10 MG PO TABS
10.0000 mg | ORAL_TABLET | Freq: Every day | ORAL | Status: DC
  Administered 2014-12-02 – 2014-12-03 (×2): 10 mg via ORAL
  Filled 2014-12-01 (×3): qty 1

## 2014-12-01 MED ORDER — FLUTICASONE PROPIONATE 50 MCG/ACT NA SUSP
2.0000 | Freq: Two times a day (BID) | NASAL | Status: DC
  Administered 2014-12-02 – 2014-12-03 (×2): 2 via NASAL
  Filled 2014-12-01: qty 16

## 2014-12-01 MED ORDER — BUPIVACAINE LIPOSOME 1.3 % IJ SUSP
20.0000 mL | Freq: Once | INTRAMUSCULAR | Status: DC
  Filled 2014-12-01: qty 20

## 2014-12-01 MED ORDER — PROMETHAZINE HCL 25 MG/ML IJ SOLN: 6.2500 mg | INTRAMUSCULAR | Status: DC | PRN

## 2014-12-01 MED ORDER — LEVOTHYROXINE SODIUM 100 MCG PO TABS
100.0000 ug | ORAL_TABLET | Freq: Every day | ORAL | Status: DC
  Administered 2014-12-02 – 2014-12-03 (×2): 100 ug via ORAL
  Filled 2014-12-01 (×4): qty 1

## 2014-12-01 MED ORDER — METOCLOPRAMIDE HCL 5 MG/ML IJ SOLN: 5.0000 mg | Freq: Three times a day (TID) | INTRAMUSCULAR | Status: DC | PRN

## 2014-12-01 MED ORDER — 0.9 % SODIUM CHLORIDE (POUR BTL) OPTIME
TOPICAL | Status: DC | PRN
  Administered 2014-12-01: 1000 mL

## 2014-12-01 MED ORDER — DIPHENHYDRAMINE HCL 12.5 MG/5ML PO ELIX: 12.5000 mg | ORAL_SOLUTION | ORAL | Status: DC | PRN

## 2014-12-01 MED ORDER — TRAMADOL HCL 50 MG PO TABS: 50.0000 mg | ORAL_TABLET | Freq: Four times a day (QID) | ORAL | Status: DC | PRN

## 2014-12-01 MED ORDER — TRANEXAMIC ACID 100 MG/ML IV SOLN
2000.0000 mg | Freq: Once | INTRAVENOUS | Status: AC
  Administered 2014-12-01: 2000 mg via TOPICAL
  Filled 2014-12-01: qty 20

## 2014-12-01 SURGICAL SUPPLY — 40 items
BAG ZIPLOCK 12X15 (MISCELLANEOUS) IMPLANT
BLADE EXTENDED COATED 6.5IN (ELECTRODE) ×3 IMPLANT
BLADE SAG 18X100X1.27 (BLADE) ×3 IMPLANT
CAPT HIP TOTAL 2 ×3 IMPLANT
CLOSURE WOUND 1/2 X4 (GAUZE/BANDAGES/DRESSINGS) ×1
COVER PERINEAL POST (MISCELLANEOUS) ×3 IMPLANT
DECANTER SPIKE VIAL GLASS SM (MISCELLANEOUS) ×3 IMPLANT
DRAPE C-ARM 42X120 X-RAY (DRAPES) ×3 IMPLANT
DRAPE STERI IOBAN 125X83 (DRAPES) ×3 IMPLANT
DRAPE U-SHAPE 47X51 STRL (DRAPES) ×9 IMPLANT
DRSG ADAPTIC 3X8 NADH LF (GAUZE/BANDAGES/DRESSINGS) ×3 IMPLANT
DRSG MEPILEX BORDER 4X4 (GAUZE/BANDAGES/DRESSINGS) ×3 IMPLANT
DRSG MEPILEX BORDER 4X8 (GAUZE/BANDAGES/DRESSINGS) ×3 IMPLANT
DURAPREP 26ML APPLICATOR (WOUND CARE) ×3 IMPLANT
ELECT REM PT RETURN 9FT ADLT (ELECTROSURGICAL) ×3
ELECTRODE REM PT RTRN 9FT ADLT (ELECTROSURGICAL) ×1 IMPLANT
EVACUATOR 1/8 PVC DRAIN (DRAIN) ×3 IMPLANT
FACESHIELD WRAPAROUND (MASK) ×12 IMPLANT
GLOVE BIO SURGEON STRL SZ7.5 (GLOVE) ×3 IMPLANT
GLOVE BIO SURGEON STRL SZ8 (GLOVE) ×6 IMPLANT
GLOVE BIOGEL PI IND STRL 8 (GLOVE) ×2 IMPLANT
GLOVE BIOGEL PI INDICATOR 8 (GLOVE) ×4
GOWN STRL REUS W/TWL LRG LVL3 (GOWN DISPOSABLE) ×3 IMPLANT
GOWN STRL REUS W/TWL XL LVL3 (GOWN DISPOSABLE) ×3 IMPLANT
KIT BASIN OR (CUSTOM PROCEDURE TRAY) ×3 IMPLANT
NDL SAFETY ECLIPSE 18X1.5 (NEEDLE) ×2 IMPLANT
NEEDLE HYPO 18GX1.5 SHARP (NEEDLE) ×4
PACK TOTAL JOINT (CUSTOM PROCEDURE TRAY) ×3 IMPLANT
PEN SKIN MARKING BROAD (MISCELLANEOUS) ×3 IMPLANT
STRIP CLOSURE SKIN 1/2X4 (GAUZE/BANDAGES/DRESSINGS) ×2 IMPLANT
SUT ETHIBOND NAB CT1 #1 30IN (SUTURE) ×3 IMPLANT
SUT MNCRL AB 4-0 PS2 18 (SUTURE) ×3 IMPLANT
SUT VIC AB 2-0 CT1 27 (SUTURE) ×6
SUT VIC AB 2-0 CT1 TAPERPNT 27 (SUTURE) ×3 IMPLANT
SUT VLOC 180 0 24IN GS25 (SUTURE) ×6 IMPLANT
SYR 20CC LL (SYRINGE) ×3 IMPLANT
SYR 50ML LL SCALE MARK (SYRINGE) ×3 IMPLANT
TOWEL OR 17X26 10 PK STRL BLUE (TOWEL DISPOSABLE) ×3 IMPLANT
TOWEL OR NON WOVEN STRL DISP B (DISPOSABLE) IMPLANT
TRAY FOLEY CATH 14FRSI W/METER (CATHETERS) ×3 IMPLANT

## 2014-12-01 NOTE — Progress Notes (Signed)
Clinical Social Work Department BRIEF PSYCHOSOCIAL ASSESSMENT 12/01/2014  Patient:  Michaela Morris, Michaela Morris     Account Number:  [id number]     Admit date:  12/01/2014  Clinical Social Worker:  Lacie Scotts  Date/Time:  12/01/2014 05:11 PM  Referred by:  CSW  Date Referred:  12/01/2014 Referred for  SNF Placement   Other Referral:   Interview type:  Patient Other interview type:    PSYCHOSOCIAL DATA Living Status:  HUSBAND Admitted from facility:   Level of care:   Primary support name:  Michaela Morris Primary support relationship to patient:  CHILD, ADULT Degree of support available:   supportive    CURRENT CONCERNS Current Concerns  Post-Acute Placement   Other Concerns:    SOCIAL WORK ASSESSMENT / PLAN Pt is a 69 yr old female living at home prior to hospitalization. CSW met with pt / daughters to assist with d/c planning. This is a planned admission. Pt hopes to return home following hospital d/c. If ST Rehab is needed pt would like to go to [facility] in [place]. CSW will be available to assist with d/c planning, as needed.   Assessment/plan status:  Psychosocial Support/Ongoing Assessment of Needs Other assessment/ plan:   Information/referral to community resources:   Home services vs SNF placement reviewed.    PATIENT'S/FAMILY'S RESPONSE TO PLAN OF CARE: Pt is happy her surgery is over. Her pain is being controlled. " Dr. Wynelle Link feels I may be able to return home when I leave the hospital." Pt is motivated to work with therapy.    Michaela Lean LCSW [phone]

## 2014-12-01 NOTE — Progress Notes (Signed)
Pt noted to have Low BP per CNA VS. Assessed pt and noted pt to not be in any distress. Pt other VS within normal limits. Recheck BP and noted it to be 108/45 with 75 HR 16 Resp and 96% O2 sat. Pt alert and oriented. Pt drowsy from surgery but no other complaints at this time.

## 2014-12-01 NOTE — H&P (View-Only) (Signed)
Michaela Morris DOB: 07/20/1946 Married / Language: English / Race: White Female Date of Admission: 12/01/2014 CC:  Left Hip Pain History of Present Illness  The patient is a 68 year old female who comes in  for a preoperative History and Physical. The patient is scheduled for a total hip arthroplasty (anterior approach) to be performed by Dr. Frank V. Aluisio, MD at [facility] on 12-01-2014. The patient is a 68 year old female being followed for their left hip pain and osteoarthritis. They are several months out from intra-articular injection with Dr. Ramos. Symptoms reported include hip pain and aching and report their pain level to be mild to moderate. Current treatment includes: NSAIDs. The patient has reported some improvement of their symptoms with: Cortisone injections. She could not tell a big difference following the shot except for a big reduction in the pain at night. She still has hip pain which she describes as mostly located on the side of the hip. It has radiated down the side of the thigh toward the knee and sometimes onto the top of the thigh. She states the lateral hip shot she received a while back seemed to help more that this most recent shot. She has several issues ongoing. She has been diagnosed with advanced arthritis on the left hip and also with the bursitis on that side. She also describes numnbess that she will get in the right thigh area if she stands for too long in one space. She states that she has had back issues for years and was told that she has a spondylolisthesis with some stenosis. The left hip is what is bothering her the most. The intra-articular injection helped for a short amount of time. She already has recurrent pain. She also has pain in her left knee. Her back does bother her too. The most debilitating thing for her is the left hip. She has a very hard time walking and getting around. She would like to go ahead and proceed with hip replacement  at this time. They have been treated conservatively in the past for the above stated problem and despite conservative measures, they continue to have progressive pain and severe functional limitations and dysfunction. They have failed non-operative management including home exercise, medications, and injections. It is felt that they would benefit from undergoing total joint replacement. Risks and benefits of the procedure have been discussed with the patient and they elect to proceed with surgery. There are no active contraindications to surgery such as ongoing infection or rapidly progressive neurological disease.  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 Diabetes Mellitus Father. First Degree Relatives reported Rheumatoid Arthritis Maternal Grandfather. Chronic Obstructive Lung Disease brother  Social History Current drinker 04/21/2014: Currently drinks wine only occasionally per week Tobacco use 04/21/2014 former smoker; smoke(d) less than 1/2 pack(s) per day Tobacco /  smoke exposure 04/21/2014:   no no Not under pain contract No history of drug/alcohol rehab Living situation live alone Illicit drug use no Exercise Exercises weekly; does other Exercises daily; does other Pain Contract no Number of flights of stairs before winded less than 1 1 Marital status married Children 3 Alcohol use current drinker; drinks wine; only occasionally per week Drug/Alcohol Rehab (Previously) no Drug/Alcohol Rehab (Currently) no Current work status retired Advance Directives Living Will, Healthcare POA  Medication History  Ultram (50MG Tablet, 1 (one) - 2 (two) Oral four times daily, as needed for pain, Taken starting 10/25/2014) Active. Aspirin EC (81MG Tablet DR, Oral) Active. Vitamin D3 (2000UNIT Capsule, Oral) Active. (BID) Co Q-10 (Oral) Specific dose unknown - Active. Estradiol (1MG Tablet, Oral) Active. Levothyroxine Sodium (137MCG Tablet, Oral) Active. Montelukast Sodium (10MG Tablet, Oral) Active. (Singulair) NexIUM (40MG Capsule DR, Oral) Active. Tylenol (500MG Capsule, 1 (one) Oral) Active. Pravastatin Sodium (20MG Tablet, Oral) Active. CeleBREX (200MG Capsule, Oral) Active. Desonide (0.05% Cream, External) Active. Fluticasone Propionate (Inhal) (50MCG/BLIST Aero Pow Br Act, Inhalation) Active.  Past Surgical History  Colon Polyp Removal - Colonoscopy Breast Biopsy left Arthroscopy of Shoulder right Rotator Cuff Repair right Hysterectomy complete (non-cancerous) Dilation and Curettage of Uterus  Review of Systems General Not Present- Chills, Fatigue, Fever, Memory Loss, Night Sweats, Weight Gain and Weight Loss. Skin Not Present- Eczema, Hives, Itching, Lesions and Rash. HEENT Not Present- Dentures, Double Vision, Headache, Hearing Loss, Tinnitus and Visual Loss. Respiratory Not Present- Allergies, Chronic Cough, Coughing up blood, Shortness of breath at rest and Shortness of breath with exertion. Cardiovascular  Not Present- Chest Pain, Difficulty Breathing Lying Down, Murmur, Palpitations, Racing/skipping heartbeats and Swelling. Gastrointestinal Not Present- Abdominal Pain, Bloody Stool, Constipation, Diarrhea, Difficulty Swallowing, Heartburn, Jaundice, Loss of appetitie, Nausea and Vomiting. Female Genitourinary Present- Urinary frequency. Not Present- Blood in Urine, Discharge, Flank Pain, Incontinence, Painful Urination, Urgency, Urinary Retention, Urinating at Night and Weak urinary stream. Musculoskeletal Present- Joint Pain and Morning Stiffness. Not Present- Back Pain, Joint Swelling, Muscle Pain, Muscle Weakness and Spasms. Neurological Not Present- Blackout spells, Difficulty with balance, Dizziness, Paralysis, Tremor and Weakness. Psychiatric Not Present- Insomnia.  Vitals Weight: 223 lb Height: 62in Weight was reported by patient. Height was reported by patient. Body Surface Area: 2 m Body Mass Index: 40.79 kg/m  BP: 132/82 (Sitting, Right Arm, Standard)   Physical Exam  General Mental Status -Alert, cooperative and good historian. General Appearance-pleasant, Not in acute distress. Orientation-Oriented X3. Build & Nutrition-Well nourished and Well developed.  Head and Neck Head-normocephalic, atraumatic . Neck Global Assessment - supple, no bruit auscultated on the right, no bruit auscultated on the left.  Eye Pupil - Bilateral-Regular and Round. Motion - Bilateral-EOMI.  Chest and Lung Exam Auscultation Breath sounds - clear at anterior chest wall and clear at posterior chest wall. Adventitious sounds - No Adventitious sounds.  Cardiovascular Auscultation Rhythm - Regular rate and rhythm. Heart Sounds - S1 WNL and S2 WNL. Murmurs & Other Heart Sounds - Auscultation of the heart reveals - No Murmurs.  Abdomen Palpation/Percussion Tenderness - Abdomen is non-tender to palpation. Rigidity (guarding) - Abdomen is soft. Auscultation Auscultation of  the abdomen reveals - Bowel sounds normal.  Female Genitourinary Note: Not done, not pertinent to present illness   Musculoskeletal She is alert and oriented. No apparent distress. The left hip can be flexed to about 100. No internal or external rotation. She has only about 10 degrees of abduction. The left knee shows moderate crepitus on range of motion. Range   is about 5-125. She is tender medial greater than lateral with no instability noted. Pulse, sensation and motor are intact in both lower extremities.   RADIOGRAPHS: AP pelvis and lateral of her left hip shows severe end stage arthritis left hip, bone on bone throughout. Her left knee also shows arthritic change, medial and patellofemoral.  Assessment & Plan  Primary osteoarthritis of left hip (M16.12) Note:Surgical Plans: Left Total Hip Repalcement - Anterior Approach  Disposition: Skilled Inpatient Rehab - [place] [place] - [facility] (Her husband is currently there as a resident).  PCP: Dr. Modupeola Abiodun - Her preop clearance appointment is pending at the time of the H&P.  Topical TXA  Anesthesia Issues: None  Signed electronically by Fraidy Mccarrick L Shyanne Mcclary, III PA-C 

## 2014-12-01 NOTE — Transfer of Care (Signed)
Immediate Anesthesia Transfer of Care Note  Patient: Michaela Morris  Procedure(s) Performed: Procedure(s) (LRB): LEFT TOTAL HIP ARTHROPLASTY ANTERIOR APPROACH (Left)  Patient Location: PACU  Anesthesia Type: General  Level of Consciousness: sedated, patient cooperative and responds to stimulation  Airway & Oxygen Therapy: Patient Spontanous Breathing and Patient connected to face mask oxgen  Post-op Assessment: Report given to PACU RN and Post -op Vital signs reviewed and stable  Post vital signs: Reviewed and stable  Complications: No apparent anesthesia complications

## 2014-12-01 NOTE — Progress Notes (Signed)
Utilization review completed.  

## 2014-12-01 NOTE — Anesthesia Postprocedure Evaluation (Signed)
Anesthesia Post Note  Patient: Michaela BankerLinda Morris  Procedure(s) Performed: Procedure(s) (LRB): LEFT TOTAL HIP ARTHROPLASTY ANTERIOR APPROACH (Left)  Anesthesia type: General  Patient location: PACU  Post pain: Pain level controlled  Post assessment: Post-op Vital signs reviewed  Last Vitals: BP 65/42 mmHg  Pulse 69  Temp(Src) 36.3 C (Oral)  Resp 14  Ht 5\' 2"  (1.575 m)  Wt 223 lb (101.152 kg)  BMI 40.78 kg/m2  SpO2 99%  Post vital signs: Reviewed  Level of consciousness: sedated  Complications: No apparent anesthesia complications

## 2014-12-01 NOTE — Anesthesia Procedure Notes (Signed)
Procedure Name: Intubation Date/Time: 12/01/2014 8:34 AM Performed by: Jarvis NewcomerARMISTEAD, Michaela Amesquita A Pre-anesthesia Checklist: Patient identified, Emergency Drugs available, Suction available and Patient being monitored Patient Re-evaluated:Patient Re-evaluated prior to inductionOxygen Delivery Method: Circle system utilized Preoxygenation: Pre-oxygenation with 100% oxygen Intubation Type: IV induction Laryngoscope Size: Mac and 4 Grade View: Grade I Tube type: Oral Tube size: 7.5 mm Number of attempts: 1 Airway Equipment and Method: Stylet Placement Confirmation: ETT inserted through vocal cords under direct vision,  positive ETCO2,  CO2 detector and breath sounds checked- equal and bilateral Secured at: 22 cm Tube secured with: Tape Dental Injury: Teeth and Oropharynx as per pre-operative assessment

## 2014-12-01 NOTE — Evaluation (Signed)
Physical Therapy Evaluation Patient Details Name: Michaela BankerLinda Guillotte MRN: [id number] DOB: [birth year] Today's Date: 12/01/2014   History of Present Illness  L THR  Clinical Impression  Pt s/p L THR presents with decreased L LE strength/ROM and post op pain limiting functional mobility.  Pt should progress to d.c home with family assist and HHPT follow up.    Follow Up Recommendations Home health PT    Equipment Recommendations  None recommended by PT    Recommendations for Other Services OT consult     Precautions / Restrictions Precautions Precautions: Fall Restrictions Weight Bearing Restrictions: No Other Position/Activity Restrictions: WBAT      Mobility  Bed Mobility Overal bed mobility: Needs Assistance Bed Mobility: Supine to Sit     Supine to sit: Min assist;Mod assist     General bed mobility comments: cues for sequence and use of R LE to self assist  Transfers Overall transfer level: Needs assistance Equipment used: Rolling walker (2 wheeled) Transfers: Sit to/from Stand Sit to Stand: Min assist;From elevated surface         General transfer comment: cues for LE management and use of UEs to self assist  Ambulation/Gait Ambulation/Gait assistance: Min assist Ambulation Distance (Feet): 90 Feet Assistive device: Rolling walker (2 wheeled) Gait Pattern/deviations: Decreased step length - right;Decreased step length - left;Step-to pattern;Step-through pattern;Shuffle;Trunk flexed Gait velocity: decr   General Gait Details: cues for posture, position from RW and initial sequence  Stairs            Wheelchair Mobility    Modified Rankin (Stroke Patients Only)       Balance                                             Pertinent Vitals/Pain Pain Assessment: 0-10 Pain Score: 3  Pain Location: L hip/thigh Pain Descriptors / Indicators: Aching;Burning Pain Intervention(s): Premedicated before session;Monitored during  session;Limited activity within patient's tolerance;Ice applied    Home Living Family/patient expects to be discharged to:: Private residence Living Arrangements: Alone Available Help at Discharge: Family;Friend(s) Type of Home: House Home Access: Stairs to enter Entrance Stairs-Rails: None Entrance Stairs-Number of Steps: 1 Home Layout: One level Home Equipment: Environmental consultantWalker - 2 wheels;Cane - quad      Prior Function Level of Independence: Independent;Independent with assistive device(s)               Hand Dominance   Dominant Hand: Right    Extremity/Trunk Assessment   Upper Extremity Assessment: Overall WFL for tasks assessed           Lower Extremity Assessment: LLE deficits/detail   LLE Deficits / Details: 2+/5 hip strength with AAROM at hip to 80 flex and 15 abd  Cervical / Trunk Assessment: Normal  Communication   Communication: No difficulties  Cognition Arousal/Alertness: Awake/alert Behavior During Therapy: WFL for tasks assessed/performed Overall Cognitive Status: Within Functional Limits for tasks assessed                      General Comments      Exercises Total Joint Exercises Ankle Circles/Pumps: AROM;Left;15 reps;Supine Quad Sets: AROM;Both;10 reps;Supine Heel Slides: AAROM;Left;15 reps;Supine Hip ABduction/ADduction: AAROM;Left;15 reps;Supine      Assessment/Plan    PT Assessment Patient needs continued PT services  PT Diagnosis Difficulty walking   PT Problem List Decreased strength;Decreased range of motion;Decreased activity  tolerance;Decreased mobility;Decreased knowledge of use of DME;Decreased knowledge of precautions;Pain  PT Treatment Interventions DME instruction;Gait training;Stair training;Functional mobility training;Therapeutic activities;Therapeutic exercise;Patient/family education   PT Goals (Current goals can be found in the Care Plan section) Acute Rehab PT Goals Patient Stated Goal: Resume previous lifestyle  with decreased pain PT Goal Formulation: With patient Time For Goal Achievement: 12/08/14 Potential to Achieve Goals: Good    Frequency 7X/week   Barriers to discharge        Co-evaluation               End of Session Equipment Utilized During Treatment: Gait belt Activity Tolerance: Patient tolerated treatment well Patient left: in chair;with call bell/phone within reach;with family/visitor present Nurse Communication: Mobility status         Time: 1191-4782 PT Time Calculation (min) (ACUTE ONLY): 40 min   Charges:   PT Evaluation $Initial PT Evaluation Tier I: 1 Procedure PT Treatments $Gait Training: 8-22 mins $Therapeutic Exercise: 8-22 mins   PT G Codes:        Meila Berke 12/10/2014, 5:12 PM

## 2014-12-01 NOTE — Plan of Care (Signed)
Problem: Phase I Progression Outcomes Goal: Initial discharge plan identified Outcome: Completed/Met Date Met:  12/01/14 Pt plans to either go home or to inpatient rehab. Pt wants to discuss options with case management.

## 2014-12-01 NOTE — Op Note (Signed)
OPERATIVE REPORT  PREOPERATIVE DIAGNOSIS: Osteoarthritis of the Left hip.   POSTOPERATIVE DIAGNOSIS: Osteoarthritis of the Left  hip.   PROCEDURE: Left total hip arthroplasty, anterior approach.   SURGEON: Ollen GrossFrank Sullivan Blasing, MD   ASSISTANT: Avel Peacerew Perkins, PA-C  ANESTHESIA:  General  ESTIMATED BLOOD LOSS:-800 ml    DRAINS: Hemovac x1.   COMPLICATIONS: None   CONDITION: PACU - hemodynamically stable.   BRIEF CLINICAL NOTE: Michaela Morris is a 69 y.o. female who has advanced end-  stage arthritis of her Left  hip with progressively worsening pain and  dysfunction.The patient has failed nonoperative management and presents for  total hip arthroplasty.   PROCEDURE IN DETAIL: After successful administration of spinal  anesthetic, the traction boots [facility] were placed on both  feet and the [name], boots placed into the leg  holders. The Left hip was then isolated from the perineum with plastic  drapes and prepped and draped in the usual sterile fashion. ASIS and  greater trochanter were marked and a oblique incision was made, starting  at about 1 cm lateral and 2 cm distal to the ASIS and coursing towards  the anterior cortex of the femur. The skin was cut with a 10 blade  through subcutaneous tissue to the level of the fascia overlying the  tensor fascia lata muscle. The fascia was then incised in line with the  incision at the junction of the anterior third and posterior 2/3rd. The  muscle was teased off the fascia and then the interval between the TFL  and the rectus was developed. The Hohmann retractor was then placed at  the top of the femoral neck over the capsule. The vessels overlying the  capsule were cauterized and the fat on top of the capsule was removed.  A Hohmann retractor was then placed anterior underneath the rectus  femoris to give exposure to the entire anterior capsule. A T-shaped  capsulotomy was performed. The  edges were tagged and the femoral head  was identified.       Osteophytes are removed off the superior acetabulum.  The femoral neck was then cut in situ with an oscillating saw. Traction  was then applied to the left lower extremity utilizing the [facility]  traction. The femoral head was then removed. Retractors were placed  around the acetabulum and then circumferential removal of the labrum was  performed. Osteophytes were also removed. Reaming starts at 43 mm to  medialize and  Increased in 2 mm increments to 47 mm. We reamed in  approximately 40 degrees of abduction, 20 degrees anteversion. A 48 mm  pinnacle acetabular shell was then impacted in anatomic position under  fluoroscopic guidance with excellent purchase. We did not need to place  any additional dome screws. A 28 mmm Neutral + 4 marathon liner was then  placed into the acetabular shell.       The femoral lift was then placed along the lateral aspect of the femur  just distal to the vastus ridge. The leg was  externally rotated and capsule  was stripped off the inferior aspect of the femoral neck down to the  level of the lesser trochanter, this was done with electrocautery. The femur was lifted after this was performed. The  leg was then placed and extended in adducted position to essentially delivering the femur. We also removed the capsule superiorly and the  piriformis from the piriformis  fossa to gain excellent exposure of the  proximal femur. Rongeur was used to remove some cancellous bone to get  into the lateral portion of the proximal femur for placement of the  initial starter reamer. The starter broaches was placed  the starter broach  and was shown to go down the center of the canal. Broaching  with the  Corail system was then performed starting at size 8, coursing  Up to size 10. A size 10 had excellent torsional and rotational  and axial stability. The trial standard offset neck was then placed  with a 28 + 1.5 trial  head. The hip was then reduced. We confirmed that  the stem was in the canal both on AP and lateral x-rays. It also has excellent sizing. The hip was reduced with outstanding stability through full extension, full external rotation,  and then flexion in adduction internal rotation. AP pelvis was taken  and the leg lengths were measured and found to be exactly equal. Hip  was then dislocated again and the femoral head and neck removed. The  femoral broach was removed. Size 10 Corail stem with a standard offset  neck was then impacted into the femur following native anteversion. Has  excellent purchase in the canal. Excellent torsional and rotational and  axial stability. It is confirmed to be in the canal on AP and lateral  fluoroscopic views. The 28 + 1.5 ceramic head was placed and the hip  reduced with outstanding stability. Again AP pelvis was taken and it  confirmed that the leg lengths were equal. The wound was then copiously  irrigated with saline solution and the capsule reattached and repaired  with Ethibond suture.  20 mL of Exparel mixed with 50 mL of saline then additional 20 ml of .25% Bupivicaine injected into the capsule and into the edge of the tensor fascia lata as well as subcutaneous tissue. The fascia overlying the tensor fascia lata was  then closed with a running #1 V-Loc. Subcu was closed with interrupted  2-0 Vicryl and subcuticular running 4-0 Monocryl. Incision was cleaned  and dried. Steri-Strips and a bulky sterile dressing applied. Hemovac  drain was hooked to suction and then he was awakened and transported to  recovery in stable condition.        Please note that a surgical assistant was a medical necessity for this procedure to perform it in a safe and expeditious manner. Assistant was necessary to provide appropriate retraction of vital neurovascular structures and to prevent femoral fracture and allow for anatomic placement of the prosthesis.  Ollen Gross, M.D.

## 2014-12-01 NOTE — Interval H&P Note (Signed)
History and Physical Interval Note:  12/01/2014 8:15 AM  Michaela Morris  has presented today for surgery, with the diagnosis of OA LEFT HIP  The various methods of treatment have been discussed with the patient and family. After consideration of risks, benefits and other options for treatment, the patient has consented to  Procedure(s): LEFT TOTAL HIP ARTHROPLASTY ANTERIOR APPROACH (Left) as a surgical intervention .  The patient's history has been reviewed, patient examined, no change in status, stable for surgery.  I have reviewed the patient's chart and labs.  Questions were answered to the patient's satisfaction.     Loanne DrillingALUISIO,Kearah Gayden V

## 2014-12-02 LAB — CBC
HEMATOCRIT: 29.3 % — AB (ref 36.0–46.0)
HEMOGLOBIN: 9.9 g/dL — AB (ref 12.0–15.0)
MCH: 40.6 pg — ABNORMAL HIGH (ref 26.0–34.0)
MCHC: 33.8 g/dL (ref 30.0–36.0)
MCV: 120.1 fL — AB (ref 78.0–100.0)
Platelets: 207 10*3/uL (ref 150–400)
RBC: 2.44 MIL/uL — ABNORMAL LOW (ref 3.87–5.11)
RDW: 13.7 % (ref 11.5–15.5)
WBC: 12.9 10*3/uL — AB (ref 4.0–10.5)

## 2014-12-02 LAB — BASIC METABOLIC PANEL
ANION GAP: 9 (ref 5–15)
BUN: 8 mg/dL (ref 6–23)
CALCIUM: 8.6 mg/dL (ref 8.4–10.5)
CO2: 28 mmol/L (ref 19–32)
Chloride: 102 mmol/L (ref 96–112)
Creatinine, Ser: 0.44 mg/dL — ABNORMAL LOW (ref 0.50–1.10)
GFR calc Af Amer: >90 mL/min (ref >90)
Glucose, Bld: 138 mg/dL — ABNORMAL HIGH (ref 70–99)
Potassium: 4 mmol/L (ref 3.5–5.1)
Sodium: 139 mmol/L (ref 135–145)

## 2014-12-02 MED ORDER — ESOMEPRAZOLE MAGNESIUM 40 MG PO CPDR
40.0000 mg | DELAYED_RELEASE_CAPSULE | Freq: Every day | ORAL | Status: DC
  Filled 2014-12-02 (×2): qty 1

## 2014-12-02 MED ORDER — NON FORMULARY: 40.0000 mg | Freq: Every day | Status: DC

## 2014-12-02 NOTE — Care Management Note (Signed)
    Page 1 of 2   12/02/2014     1:50:44 PM CARE MANAGEMENT NOTE 12/02/2014  Patient:  Michaela Morris, Michaela Morris   Account Number:  [id number]  Date Initiated:  12/02/2014  Documentation initiated by:  [facility]  Subjective/Objective Assessment:   adm: LEFT TOTAL HIP ARTHROPLASTY ANTERIOR APPROACH (Left)     Action/Plan:   discharge planning   Anticipated DC Date:  12/02/2014   Anticipated DC Plan:  [place]  CM consult      [facility] Choice  HOME HEALTH   Choice offered to / List presented to:  C-1 Patient   DME arranged  3-N-1      DME agency  [place] arranged  HH-2 PT  HH-3 OT      Status of service:  Completed, signed off Medicare Important Message given?   (If response is "NO", the following Medicare IM given date fields will be blank) Date Medicare IM given:   Medicare IM given by:   Date Additional Medicare IM given:   Additional Medicare IM given by:    Discharge Disposition:  [place]  Per UR Regulation:    If discussed at Long Length of Stay Meetings, dates discussed:    Comments:  12/02/14 13:45 Cm met with pt in room to offer choice of home health agency.  Pt chooses [facility] in [place]. CM called [facility] and spoke with [phone]  who requested I fax facesheet, orders, F2F, H&P, OP Note, and PT/OT EVALs. Cm faxed requested information to [phone].  Address and contact information verified with pt with addition of her PCP: Berniece Andreas, MD.  CM called [facility] to please deliver the 3n1 to pt's room prior to discharge.  No other CM needs were communicated.  Mariane Masters, BSN, CM [phone].

## 2014-12-02 NOTE — Progress Notes (Signed)
Physical Therapy Treatment Patient Details Name: Wynn BankerLinda Bettcher MRN: [id number] DOB: [birth year] Today's Date: 12/02/2014    History of Present Illness L THR    PT Comments    Progressing well and motivated to dc home  Follow Up Recommendations  Home health PT     Equipment Recommendations  None recommended by PT    Recommendations for Other Services OT consult     Precautions / Restrictions Precautions Precautions: Fall Restrictions Weight Bearing Restrictions: No Other Position/Activity Restrictions: WBAT    Mobility  Bed Mobility Overal bed mobility: Needs Assistance Bed Mobility: Supine to Sit     Supine to sit: Min assist     General bed mobility comments: cues for sequence and use of R LE to self assist  Transfers Overall transfer level: Needs assistance Equipment used: Rolling walker (2 wheeled) Transfers: Sit to/from Stand Sit to Stand: Min assist         General transfer comment: verbal cues hand placement and LE management.   Ambulation/Gait Ambulation/Gait assistance: Min assist;Min guard Ambulation Distance (Feet): 200 Feet Assistive device: Rolling walker (2 wheeled) Gait Pattern/deviations: Step-to pattern;Step-through pattern;Decreased step length - right;Decreased step length - left;Shuffle;Trunk flexed Gait velocity: decr   General Gait Details: cues for posture, position from RW and initial sequence   Stairs            Wheelchair Mobility    Modified Rankin (Stroke Patients Only)       Balance                                    Cognition Arousal/Alertness: Awake/alert Behavior During Therapy: WFL for tasks assessed/performed Overall Cognitive Status: Within Functional Limits for tasks assessed                      Exercises Total Joint Exercises Ankle Circles/Pumps: AROM;Left;15 reps;Supine Quad Sets: AROM;Both;10 reps;Supine Gluteal Sets: AROM;Both;10 reps;Supine Heel Slides:  AAROM;Left;Supine;20 reps Hip ABduction/ADduction: AAROM;Left;Supine;20 reps    General Comments General comments (skin integrity, edema, etc.): min assist standing balance to pull up gown.      Pertinent Vitals/Pain Pain Assessment: 0-10 Pain Score: 4  Pain Location: L hip/thigh Pain Descriptors / Indicators: Aching;Burning Pain Intervention(s): Repositioned;Ice applied    Home Living Family/patient expects to be discharged to:: Private residence Living Arrangements: Alone Available Help at Discharge: Family;Friend(s) Type of Home: House Home Access: Stairs to enter Entrance Stairs-Rails: None Home Layout: One level Home Equipment: Environmental consultantWalker - 2 wheels;Cane - quad      Prior Function Level of Independence: Independent;Independent with assistive device(s)          PT Goals (current goals can now be found in the care plan section) Acute Rehab PT Goals Patient Stated Goal: Resume previous lifestyle with decreased pain PT Goal Formulation: With patient Time For Goal Achievement: 12/08/14 Potential to Achieve Goals: Good Progress towards PT goals: Progressing toward goals    Frequency  7X/week    PT Plan Current plan remains appropriate    Co-evaluation             End of Session Equipment Utilized During Treatment: Gait belt Activity Tolerance: Patient tolerated treatment well Patient left: in chair;with call bell/phone within reach;with family/visitor present     Time: [id number] PT Time Calculation (min) (ACUTE ONLY): 38 min  Charges:  $Gait Training: 23-37 mins $Therapeutic Exercise: 8-22 mins  G Codes:      Shey Bartmess 12-30-14, 12:19 PM

## 2014-12-02 NOTE — Discharge Instructions (Addendum)
°Dr. Frank Aluisio °Total Joint Specialist °[facility] °[address] °[place], [place] [address] °[phone] ° ° ° °ANTERIOR APPROACH TOTAL HIP REPLACEMENT POSTOPERATIVE DIRECTIONS ° ° °Hip Rehabilitation, Guidelines Following Surgery  °The results of a hip operation are greatly improved after range of motion and muscle strengthening exercises. Follow all safety measures which are given to protect your hip. If any of these exercises cause increased pain or swelling in your joint, decrease the amount until you are comfortable again. Then slowly increase the exercises. Call your caregiver if you have problems or questions.  °HOME CARE INSTRUCTIONS  °Most of the following instructions are designed to prevent the dislocation of your new hip.  °Remove items at home which could result in a fall. This includes throw rugs or furniture in walking pathways.  °Continue medications as instructed at time of discharge. °· You may have some home medications which will be placed on hold until you complete the course of blood thinner medication. °· You may start showering once you are discharged home but do not submerge the incision under water. Just pat the incision dry and apply a dry gauze dressing on daily. °Do not put on socks or shoes without following the instructions of your caregivers.  °Sit on high chairs which makes it easier to stand.  °Sit on chairs with arms. Use the chair arms to help push yourself up when arising.  °Keep your leg on the side of the operation out in front of you when standing up.  °Arrange for the use of a toilet seat elevator so you are not sitting low.   °· Walk with walker as instructed.  °You may resume a sexual relationship in one month or when given the OK by your caregiver.  °Use walker as long as suggested by your caregivers.  °You may put full weight on your legs and walk as much as is comfortable. °Avoid periods of inactivity such as sitting longer than an hour  when not asleep. This helps prevent blood clots.  °You may return to work once you are cleared by your surgeon.  °Do not drive a car for 6 weeks or until released by your surgeon.  °Do not drive while taking narcotics.  °Wear elastic stockings for three weeks following surgery during the day but you may remove then at night.  °Make sure you keep all of your appointments after your operation with all of your doctors and caregivers. You should call the office at the above phone number and make an appointment for approximately two weeks after the date of your surgery. °Change the dressing daily and reapply a dry dressing each time. °Please pick up a stool softener and laxative for home use as long as you are requiring pain medications. °· ICE to the affected hip every three hours for 30 minutes at a time and then as needed for pain and swelling.  Continue to use ice on the hip for pain and swelling from surgery. You may notice swelling that will progress down to the foot and ankle.  This is normal after surgery.  Elevate the leg when you are not up walking on it.   °It is important for you to complete the blood thinner medication as prescribed by your doctor. °· Continue to use the breathing machine which will help keep your temperature down.  It is common for your temperature to cycle up and down following surgery, especially at night when you are not up moving around   and exerting yourself.  The breathing machine keeps your lungs expanded and your temperature down. ° °RANGE OF MOTION AND STRENGTHENING EXERCISES  °These exercises are designed to help you keep full movement of your hip joint. Follow your caregiver's or physical therapist's instructions. Perform all exercises about fifteen times, three times per day or as directed. Exercise both hips, even if you have had only one joint replacement. These exercises can be done on a training (exercise) mat, on the floor, on a table or on a bed. Use whatever works the best  and is most comfortable for you. Use music or television while you are exercising so that the exercises are a pleasant break in your day. This will make your life better with the exercises acting as a break in routine you can look forward to.  °Lying on your back, slowly slide your foot toward your buttocks, raising your knee up off the floor. Then slowly slide your foot back down until your leg is straight again.  °Lying on your back spread your legs as far apart as you can without causing discomfort.  °Lying on your side, raise your upper leg and foot straight up from the floor as far as is comfortable. Slowly lower the leg and repeat.  °Lying on your back, tighten up the muscle in the front of your thigh (quadriceps muscles). You can do this by keeping your leg straight and trying to raise your heel off the floor. This helps strengthen the largest muscle supporting your knee.  °Lying on your back, tighten up the muscles of your buttocks both with the legs straight and with the knee bent at a comfortable angle while keeping your heel on the floor.  ° °SKILLED REHAB INSTRUCTIONS: °If the patient is transferred to a skilled rehab facility following release from the hospital, a list of the current medications will be sent to the facility for the patient to continue.  When discharged from the skilled rehab facility, please have the facility set up the patient's Home Health Physical Therapy prior to being released. Also, the skilled facility will be responsible for providing the patient with their medications at time of release from the facility to include their pain medication, the muscle relaxants, and their blood thinner medication. If the patient is still at the rehab facility at time of the two week follow up appointment, the skilled rehab facility will also need to assist the patient in arranging follow up appointment in our office and any transportation needs. ° °MAKE SURE YOU:  °Understand these instructions.   °Will watch your condition.  °Will get help right away if you are not doing well or get worse. ° °Pick up stool softner and laxative for home use following surgery while on pain medications. °Do not submerge incision under water. °Please use good hand washing techniques while changing dressing each day. °May shower starting three days after surgery. °Please use a clean towel to pat the incision dry following showers. °Continue to use ice for pain and swelling after surgery. °Do not use any lotions or creams on the incision until instructed by your surgeon. °Total Hip Protocol. ° °Take Xarelto for two and a half more weeks, then discontinue Xarelto. °Once the patient has completed the Xarelto, they may resume the 81 mg Aspirin. ° ° °Postoperative Constipation Protocol ° °Constipation - defined medically as fewer than three stools per week and severe constipation as less than one stool per week. ° °One of the most common issues patients   have following surgery is constipation.  Even if you have a regular bowel pattern at home, your normal regimen is likely to be disrupted due to multiple reasons following surgery.  Combination of anesthesia, postoperative narcotics, change in appetite and fluid intake all can affect your bowels.  In order to avoid complications following surgery, here are some recommendations in order to help you during your recovery period. ° °Colace (docusate) - Pick up an over-the-counter form of Colace or another stool softener and take twice a day as long as you are requiring postoperative pain medications.  Take with a full glass of water daily.  If you experience loose stools or diarrhea, hold the colace until you stool forms back up.  If your symptoms do not get better within 1 week or if they get worse, check with your doctor. ° °Dulcolax (bisacodyl) - Pick up over-the-counter and take as directed by the product packaging as needed to assist with the movement of your bowels.  Take with a full  glass of water.  Use this product as needed if not relieved by Colace only.  ° °MiraLax (polyethylene glycol) - Pick up over-the-counter to have on hand.  MiraLax is a solution that will increase the amount of water in your bowels to assist with bowel movements.  Take as directed and can mix with a glass of water, juice, soda, coffee, or tea.  Take if you go more than two days without a movement. °Do not use MiraLax more than once per day. Call your doctor if you are still constipated or irregular after using this medication for 7 days in a row. ° °If you continue to have problems with postoperative constipation, please contact the office for further assistance and recommendations.  If you experience "the worst abdominal pain ever" or develop nausea or vomiting, please contact the office immediatly for further recommendations for treatment. ° ° ° °Information on my medicine - XARELTO® (Rivaroxaban) ° °This medication education was reviewed with me or my healthcare representative as part of my discharge preparation.  The pharmacist that spoke with me during my hospital stay was:  Jackson, Rachel E, RPH ° °Why was Xarelto® prescribed for you? °Xarelto® was prescribed for you to reduce the risk of blood clots forming after orthopedic surgery. The medical term for these abnormal blood clots is venous thromboembolism (VTE). ° °What do you need to know about xarelto® ? °Take your Xarelto® ONCE DAILY at the same time every day. °You may take it either with or without food. ° °If you have difficulty swallowing the tablet whole, you may crush it and mix in applesauce just prior to taking your dose. ° °Take Xarelto® exactly as prescribed by your doctor and DO NOT stop taking Xarelto® without talking to the doctor who prescribed the medication.  Stopping without other VTE prevention medication to take the place of Xarelto® may increase your risk of developing a clot. ° °After discharge, you should have regular check-up  appointments with your healthcare provider that is prescribing your Xarelto®.   ° °What do you do if you miss a dose? °If you miss a dose, take it as soon as you remember on the same day then continue your regularly scheduled once daily regimen the next day. Do not take two doses of Xarelto® on the same day.  ° °Important Safety Information °A possible side effect of Xarelto® is bleeding. You should call your healthcare provider right away if you experience any of the following: °? Bleeding   from an injury or your nose that does not stop. °? Unusual colored urine (red or dark brown) or unusual colored stools (red or black). °? Unusual bruising for unknown reasons. °? A serious fall or if you hit your head (even if there is no bleeding). ° °Some medicines may interact with Xarelto® and might increase your risk of bleeding while on Xarelto®. To help avoid this, consult your healthcare provider or pharmacist prior to using any new prescription or non-prescription medications, including herbals, vitamins, non-steroidal anti-inflammatory drugs (NSAIDs) and supplements. ° °This website has more information on Xarelto®: www.xarelto.com. ° ° °

## 2014-12-02 NOTE — Procedures (Signed)
Pt on home cpap machine with nasal pillows.   Machine inspected by RT.  Pt resting comfortably at this time.

## 2014-12-02 NOTE — Evaluation (Signed)
Occupational Therapy Evaluation Patient Details Name: Michaela Morris MRN: [id number] DOB: 1945-12-22 Today's Date: 12/02/2014    History of Present Illness L THR direct anterior   Clinical Impression   Pt doing well. Practiced up to 3in1 and with AE for LB self care. Will follow to progress ADL independence.     Follow Up Recommendations  Home health OT;Supervision/Assistance - 24 hour    Equipment Recommendations  3 in 1 bedside comode    Recommendations for Other Services       Precautions / Restrictions Precautions Precautions: Fall Restrictions Weight Bearing Restrictions: No Other Position/Activity Restrictions: WBAT      Mobility Bed Mobility                  Transfers Overall transfer level: Needs assistance Equipment used: Rolling walker (2 wheeled) Transfers: Sit to/from Stand Sit to Stand: Min assist         General transfer comment: verbal cues hand placement and LE management.     Balance                                            ADL   Eating/Feeding: Independent;Sitting   Grooming: Wash/dry hands;Set up;Sitting   Upper Body Bathing: Set up;Sitting   Lower Body Bathing: Moderate assistance;Sit to/from stand   Upper Body Dressing : Set up;Sitting   Lower Body Dressing: Maximal assistance;Sit to/from stand   Toilet Transfer: Minimal assistance;Ambulation;BSC;RW   Toileting- Clothing Manipulation and Hygiene: Minimal assistance;Sit to/from stand         General ADL Comments: Educated pt on AE options and pt is interested in AE until she can fully reach her feet. Daughters present at the end of the session and will plan to obtain the kit for her. Pt practiced with donning socks with sock aid with supervision. Educated on reacher and shoe horn and sponge but pt hasnt practiced with them yet. Educated on uses of 3in1 as BSC and over the toilet. She has a tub and tubseat and recommend HHOT follow to practice tub  transfers when pt is steadier to step in tub.      Vision     Perception     Praxis      Pertinent Vitals/Pain Pain Assessment: 0-10 Pain Score: 4  Pain Location: L hip/thigh Pain Descriptors / Indicators: Aching;Burning Pain Intervention(s): Repositioned;Ice applied     Hand Dominance Right   Extremity/Trunk Assessment Upper Extremity Assessment Upper Extremity Assessment: Overall WFL for tasks assessed           Communication Communication Communication: No difficulties   Cognition Arousal/Alertness: Awake/alert Behavior During Therapy: WFL for tasks assessed/performed Overall Cognitive Status: Within Functional Limits for tasks assessed                     General Comments       Exercises       Shoulder Instructions      Home Living Family/patient expects to be discharged to:: Private residence Living Arrangements: Alone Available Help at Discharge: Family;Friend(s) Type of Home: House Home Access: Stairs to enter [employer] of Steps: 1 Entrance Stairs-Rails: None Home Layout: One level     Bathroom Shower/Tub: Teacher, early years/pre: Handicapped height     Home Equipment: Environmental consultant - 2 wheels;Cane - quad  Prior Functioning/Environment Level of Independence: Independent;Independent with assistive device(s)             OT Diagnosis: Generalized weakness   OT Problem List: Decreased strength;Decreased knowledge of use of DME or AE   OT Treatment/Interventions: Self-care/ADL training;Patient/family education;Therapeutic activities;DME and/or AE instruction    OT Goals(Current goals can be found in the care plan section) Acute Rehab OT Goals Patient Stated Goal: Resume previous lifestyle with decreased pain OT Goal Formulation: With patient Time For Goal Achievement: 12/09/14 Potential to Achieve Goals: Good  OT Frequency: Min 2X/week   Barriers to D/C:            Co-evaluation               End of Session Equipment Utilized During Treatment: Rolling walker  Activity Tolerance: Patient tolerated treatment well Patient left: in chair;with call bell/phone within reach;with family/visitor present   Time: 1610-9604 OT Time Calculation (min): 34 min Charges:  OT General Charges $OT Visit: 1 Procedure OT Evaluation $Initial OT Evaluation Tier I: 1 Procedure OT Treatments $Therapeutic Activity: 8-22 mins G-Codes:    Jules Schick  [phone] 12/02/2014, 11:14 AM

## 2014-12-02 NOTE — Progress Notes (Signed)
Physical Therapy Treatment Patient Details Name: Michaela BankerLinda Morris MRN: [id number] DOB: July 09, 1946 Today's Date: 12/02/2014    History of Present Illness L THR    PT Comments    Progressing well.  Reviewed stairs and car transfers with pt.  Follow Up Recommendations  Home health PT     Equipment Recommendations  None recommended by PT    Recommendations for Other Services OT consult     Precautions / Restrictions Precautions Precautions: Fall Restrictions Weight Bearing Restrictions: No Other Position/Activity Restrictions: WBAT    Mobility  Bed Mobility                  Transfers Overall transfer level: Needs assistance Equipment used: Rolling walker (2 wheeled) Transfers: Sit to/from Stand Sit to Stand: Min guard         General transfer comment: verbal cues hand placement and LE management.   Ambulation/Gait Ambulation/Gait assistance: Min guard Ambulation Distance (Feet): 222 Feet Assistive device: Rolling walker (2 wheeled) Gait Pattern/deviations: Step-to pattern;Step-through pattern;Decreased step length - right;Decreased step length - left;Shuffle;Trunk flexed Gait velocity: decr   General Gait Details: cues for posture, position from RW and initial sequence   Stairs Stairs: Yes Stairs assistance: Min assist Stair Management: No rails;Two rails;Step to pattern;Backwards;Forwards;With walker Number of Stairs: 7 General stair comments: single step fwd and bkwd with RW; 5 steps with Bil rails  Wheelchair Mobility    Modified Rankin (Stroke Patients Only)       Balance                                    Cognition Arousal/Alertness: Awake/alert Behavior During Therapy: WFL for tasks assessed/performed Overall Cognitive Status: Within Functional Limits for tasks assessed                      Exercises      General Comments        Pertinent Vitals/Pain Pain Assessment: 0-10 Pain Score: 3  Pain Location:  L hip/thigh Pain Descriptors / Indicators: Burning;Aching Pain Intervention(s): Limited activity within patient's tolerance;Monitored during session;Premedicated before session;Ice applied    Home Living                      Prior Function            PT Goals (current goals can now be found in the care plan section) Acute Rehab PT Goals Patient Stated Goal: Resume previous lifestyle with decreased pain PT Goal Formulation: With patient Time For Goal Achievement: 12/08/14 Potential to Achieve Goals: Good Progress towards PT goals: Progressing toward goals    Frequency  7X/week    PT Plan Current plan remains appropriate    Co-evaluation             End of Session Equipment Utilized During Treatment: Gait belt Activity Tolerance: Patient tolerated treatment well Patient left: in chair;with call bell/phone within reach;with family/visitor present     Time: 1355-1425 PT Time Calculation (min) (ACUTE ONLY): 30 min  Charges:  $Gait Training: 8-22 mins $Therapeutic Activity: 8-22 mins                    G Codes:      Michaela Morris 12/02/2014, 4:26 PM

## 2014-12-02 NOTE — Progress Notes (Signed)
   Subjective: 1 Day Post-Op Procedure(s) (LRB): LEFT TOTAL HIP ARTHROPLASTY ANTERIOR APPROACH (Left) Patient reports pain as mild.   Patient seen in rounds with [name]. Family in room. Patient is well, but has had some minor complaints of pain in the hip, requiring pain medications We will start therapy today.  Plan is to go home but can look into SNF if needed after hospital stay.  Objective: Vital signs in last 24 hours: Temp:  [97.3 F (36.3 C)-99.5 F (37.5 C)] 99.4 F (37.4 C) (02/18 1008) Pulse Rate:  [66-113] 66 (02/18 1008) Resp:  [11-20] 16 (02/18 1008) BP: (65-141)/(38-62) 139/51 mmHg (02/18 1008) SpO2:  [90 %-100 %] 99 % (02/18 1008)  Intake/Output from previous day:  Intake/Output Summary (Last 24 hours) at 12/02/14 1022 Last data filed at 12/02/14 0900  Gross per 24 hour  Intake 3601.25 ml  Output   4240 ml  Net -638.75 ml    Intake/Output this shift: Total I/O In: 240 [P.O.:240] Out: -   Labs:  Recent Labs  12/02/14 0430  HGB 9.9*    Recent Labs  12/02/14 0430  WBC 12.9*  RBC 2.44*  HCT 29.3*  PLT 207    Recent Labs  12/02/14 0430  NA 139  K 4.0  CL 102  CO2 28  BUN 8  CREATININE 0.44*  GLUCOSE 138*  CALCIUM 8.6   No results for input(s): LABPT, INR in the last 72 hours.  EXAM General - Patient is Alert, Appropriate and Oriented Extremity - Neurovascular intact Sensation intact distally Dorsiflexion/Plantar flexion intact Dressing - dressing C/D/I Motor Function - intact, moving foot and toes well on exam.  Hemovac pulled without difficulty.  Past Medical History  Diagnosis Date  . Spondylolisthesis     hx of   . Degenerative lumbar disc   . Bursitis of left hip   . Sleep apnea     uses CPAP  . Shortness of breath dyspnea     walking distances or climbing stairs  . Bronchitis     hx of   . Hypothyroidism   . GERD (gastroesophageal reflux disease)   . Hypercholesteremia   . Asthma   . Osteoarthritis    left knee/left hip   . 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   Weight as of this encounter: 101.152 kg (223 lb). Advance diet Up with therapy Discharge home with home health if does well  DVT Prophylaxis - Xarelto Weight Bearing As Tolerated left Leg Hemovac Pulled Begin Therapy  Avel Peacerew Tanish Sinkler, PA-C Orthopaedic Surgery 12/02/2014, 10:22 AM

## 2014-12-03 LAB — BASIC METABOLIC PANEL
Anion gap: 4 — ABNORMAL LOW (ref 5–15)
BUN: 11 mg/dL (ref 6–23)
CO2: 28 mmol/L (ref 19–32)
CREATININE: 0.5 mg/dL (ref 0.50–1.10)
Calcium: 8.4 mg/dL (ref 8.4–10.5)
Chloride: 104 mmol/L (ref 96–112)
Glucose, Bld: 134 mg/dL — ABNORMAL HIGH (ref 70–99)
Potassium: 3.8 mmol/L (ref 3.5–5.1)
Sodium: 136 mmol/L (ref 135–145)

## 2014-12-03 LAB — CBC
HCT: 29.7 % — ABNORMAL LOW (ref 36.0–46.0)
Hemoglobin: 10 g/dL — ABNORMAL LOW (ref 12.0–15.0)
MCH: 40.2 pg — ABNORMAL HIGH (ref 26.0–34.0)
MCHC: 33.7 g/dL (ref 30.0–36.0)
MCV: 119.3 fL — AB (ref 78.0–100.0)
PLATELETS: 208 10*3/uL (ref 150–400)
RBC: 2.49 MIL/uL — ABNORMAL LOW (ref 3.87–5.11)
RDW: 13.7 % (ref 11.5–15.5)
WBC: 18.4 10*3/uL — ABNORMAL HIGH (ref 4.0–10.5)

## 2014-12-03 MED ORDER — METHOCARBAMOL 500 MG PO TABS: 500.0000 mg | ORAL_TABLET | Freq: Four times a day (QID) | ORAL | Status: DC | PRN

## 2014-12-03 MED ORDER — RIVAROXABAN 10 MG PO TABS: 10.0000 mg | ORAL_TABLET | Freq: Every day | ORAL | Status: DC

## 2014-12-03 MED ORDER — OXYCODONE HCL 5 MG PO TABS: 5.0000 mg | ORAL_TABLET | ORAL | Status: DC | PRN

## 2014-12-03 MED ORDER — TRAMADOL HCL 50 MG PO TABS: 50.0000 mg | ORAL_TABLET | Freq: Four times a day (QID) | ORAL | Status: DC | PRN

## 2014-12-03 NOTE — Progress Notes (Signed)
Pt to d/c home with home health. Potty chair to be delivered to patient's home. Patient already has a walker. AVS reviewed and "My Chart" discussed with pt. Pt capable of verbalizing medications, dressing changes, signs and symptoms of infection, and follow-up appointments. Remains hemodynamically stable. No signs and symptoms of distress. Educated pt to return to ER in the case of SOB, dizziness, or chest pain.

## 2014-12-03 NOTE — Progress Notes (Signed)
Occupational Therapy Treatment Patient Details Name: Michaela Morris MRN: [id number] DOB: 24-Jul-1946 Today's Date: 12/03/2014    History of present illness L THR   OT comments  Pt doing well. Supposed to d/c today. Practiced with AE for LB self care and toilet transfer with 3in1 again. Recommend HHOT for addressing tub transfer.     Follow Up Recommendations  Home health OT;Supervision/Assistance - 24 hour    Equipment Recommendations  3 in 1 bedside comode    Recommendations for Other Services      Precautions / Restrictions Precautions Precautions: Fall Restrictions Weight Bearing Restrictions: No Other Position/Activity Restrictions: WBAT       Mobility Bed Mobility                  Transfers Overall transfer level: Needs assistance Equipment used: Rolling walker (2 wheeled) Transfers: Sit to/from Stand Sit to Stand: Min guard         General transfer comment: min cues    Balance                                   ADL       Grooming: Wash/dry hands;Oral care;Min guard;Standing               Lower Body Dressing: Minimal assistance;Sit to/from stand;With adaptive equipment Lower Body Dressing Details (indicate cue type and reason): min assist with reacher to don pants and underwear. min guard to stand and pull up. pt able to slip on shoes.  Toilet Transfer: Min guard;Ambulation;BSC;RW   Toileting- Architect and Hygiene: Min guard;Sit to/from stand         General ADL Comments: Discussed HHOT assessing tub transfer and pt is agreeable to sponge bathe until [facility] comes. Min guard for safety wtih functional transfers and pt doing well. Practiced with AE for LB self care. Reviewed use of LHS and shoe horn. Pt used reacher today for clothing and verbalized understanding of how to use sock aid. Pt did well standing to pull up clohting .       Vision                     Perception     Praxis      Cognition    Behavior During Therapy: WFL for tasks assessed/performed Overall Cognitive Status: Within Functional Limits for tasks assessed                       Extremity/Trunk Assessment               Exercises     Shoulder Instructions       General Comments      Pertinent Vitals/ Pain       Pain Assessment: 0-10 Pain Score: 3  Pain Location: L thigh Pain Descriptors / Indicators: Sore Pain Intervention(s): Repositioned;Ice applied  Home Living                                          Prior Functioning/Environment              Frequency Min 2X/week     Progress Toward Goals  OT Goals(current goals can now be found in the care plan section)  Progress towards OT goals: Progressing toward goals  Plan Discharge plan remains appropriate    Co-evaluation                 End of Session Equipment Utilized During Treatment: Rolling walker   Activity Tolerance Patient tolerated treatment well   Patient Left in chair;with call bell/phone within reach   Nurse Communication          Time: [id number] OT Time Calculation (min): 45 min  Charges: OT General Charges $OT Visit: 1 Procedure OT Treatments $Self Care/Home Management : 23-37 mins $Therapeutic Activity: 8-22 mins  Lennox LaityStone, Prashant Glosser [place]  [phone] 12/03/2014, 10:34 AM

## 2014-12-03 NOTE — Discharge Summary (Signed)
Physician Discharge Summary   Patient ID: Michaela Morris MRN: [id number] DOB/AGE: 69-11-1945 69 y.o.  Admit date: 12/01/2014 Discharge date: 12-03-2014  Primary Diagnosis:  Osteoarthritis of the Left hip.   Admission Diagnoses:  Past Medical History  Diagnosis Date  . Spondylolisthesis     hx of   . Degenerative lumbar disc   . Bursitis of left hip   . Sleep apnea     uses CPAP  . Shortness of breath dyspnea     walking distances or climbing stairs  . Bronchitis     hx of   . Hypothyroidism   . GERD (gastroesophageal reflux disease)   . Hypercholesteremia   . Asthma   . Osteoarthritis     left knee/left hip   . 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   Weight as of this encounter: 101.152 kg (223 lb).  Procedure(s) (LRB): LEFT TOTAL HIP ARTHROPLASTY ANTERIOR APPROACH (Left)   Consults: None  HPI: Michaela Morris is a 69 y.o. female who has advanced end-  stage arthritis of her Left hip with progressively worsening pain and  dysfunction.The patient has failed nonoperative management and presents for  total hip arthroplasty.   Laboratory Data: Admission on 12/01/2014  Component Date Value Ref Range Status  . ABO/RH(D) 12/01/2014 O POS   Final  . Antibody Screen 12/01/2014 NEG   Final  . Sample Expiration 12/01/2014 12/04/2014   Final  . WBC 12/02/2014 12.9* 4.0 - 10.5 K/uL Final  . RBC 12/02/2014 2.44* 3.87 - 5.11 MIL/uL Final  . Hemoglobin 12/02/2014 9.9* 12.0 - 15.0 g/dL Final  . HCT 12/02/2014 29.3* 36.0 - 46.0 % Final  . MCV 12/02/2014 120.1* 78.0 - 100.0 fL Final  . MCH 12/02/2014 40.6* 26.0 - 34.0 pg Final  . MCHC 12/02/2014 33.8  30.0 - 36.0 g/dL Final  . RDW 12/02/2014 13.7  11.5 - 15.5 % Final  . Platelets 12/02/2014 207  150 - 400  K/uL Final  . Sodium 12/02/2014 139  135 - 145 mmol/L Final  . Potassium 12/02/2014 4.0  3.5 - 5.1 mmol/L Final  . Chloride 12/02/2014 102  96 - 112 mmol/L Final  . CO2 12/02/2014 28  19 - 32 mmol/L Final  . Glucose, Bld 12/02/2014 138* 70 - 99 mg/dL Final  . BUN 12/02/2014 8  6 - 23 mg/dL Final  . Creatinine, Ser 12/02/2014 0.44* 0.50 - 1.10 mg/dL Final  . Calcium 12/02/2014 8.6  8.4 - 10.5 mg/dL Final  . GFR calc non Af Amer 12/02/2014 >90  >90 mL/min Final  . GFR calc Af Amer 12/02/2014 >90  >90 mL/min Final   Comment: (NOTE) The eGFR has been calculated using the CKD EPI equation. This calculation has not been validated in all clinical situations. eGFR's persistently <90 mL/min signify possible Chronic Kidney Disease.   . Anion gap 12/02/2014 9  5 - 15 Final  . WBC 12/03/2014 18.4* 4.0 - 10.5 K/uL Final  . RBC 12/03/2014 2.49* 3.87 - 5.11 MIL/uL Final  . Hemoglobin 12/03/2014 10.0* 12.0 - 15.0 g/dL Final  . HCT 12/03/2014 29.7* 36.0 - 46.0 % Final  . MCV 12/03/2014 119.3* 78.0 - 100.0 fL Final  . MCH 12/03/2014 40.2* 26.0 - 34.0 pg Final  .  MCHC 12/03/2014 33.7  30.0 - 36.0 g/dL Final  . RDW 12/03/2014 13.7  11.5 - 15.5 % Final  . Platelets 12/03/2014 208  150 - 400 K/uL Final  . Sodium 12/03/2014 136  135 - 145 mmol/L Final  . Potassium 12/03/2014 3.8  3.5 - 5.1 mmol/L Final  . Chloride 12/03/2014 104  96 - 112 mmol/L Final  . CO2 12/03/2014 28  19 - 32 mmol/L Final  . Glucose, Bld 12/03/2014 134* 70 - 99 mg/dL Final  . BUN 12/03/2014 11  6 - 23 mg/dL Final  . Creatinine, Ser 12/03/2014 0.50  0.50 - 1.10 mg/dL Final  . Calcium 12/03/2014 8.4  8.4 - 10.5 mg/dL Final  . GFR calc non Af Amer 12/03/2014 >90  >90 mL/min Final  . GFR calc Af Amer 12/03/2014 >90  >90 mL/min Final   Comment: (NOTE) The eGFR has been calculated using the CKD EPI equation. This calculation has not been validated in all clinical situations. eGFR's persistently <90 mL/min signify possible Chronic  Kidney Disease.   Georgiann Hahn gap 12/03/2014 4* 5 - 15 Final  Hospital Outpatient Visit on 11/23/2014  Component Date Value Ref Range Status  . MRSA, PCR 11/23/2014 NEGATIVE  NEGATIVE Final  . Staphylococcus aureus 11/23/2014 NEGATIVE  NEGATIVE Final   Comment:        The Xpert SA Assay (FDA approved for NASAL specimens in patients over 48 years of age), is one component of a comprehensive surveillance program.  Test performance has been validated by [facility] for patients greater than or equal to 46 year old. It is not intended to diagnose infection nor to guide or monitor treatment.   Marland Kitchen aPTT 11/23/2014 32  24 - 37 seconds Final  . Prothrombin Time 11/23/2014 12.2  11.6 - 15.2 seconds Final  . INR 11/23/2014 0.89  0.00 - 1.49 Final  . Color, Urine 11/23/2014 YELLOW  YELLOW Final  . APPearance 11/23/2014 CLEAR  CLEAR Final  . Specific Gravity, Urine 11/23/2014 1.009  1.005 - 1.030 Final  . pH 11/23/2014 6.0  5.0 - 8.0 Final  . Glucose, UA 11/23/2014 NEGATIVE  NEGATIVE mg/dL Final  . Hgb urine dipstick 11/23/2014 NEGATIVE  NEGATIVE Final  . Bilirubin Urine 11/23/2014 NEGATIVE  NEGATIVE Final  . Ketones, ur 11/23/2014 NEGATIVE  NEGATIVE mg/dL Final  . Protein, ur 11/23/2014 NEGATIVE  NEGATIVE mg/dL Final  . Urobilinogen, UA 11/23/2014 1.0  0.0 - 1.0 mg/dL Final  . Nitrite 11/23/2014 NEGATIVE  NEGATIVE Final  . Leukocytes, UA 11/23/2014 NEGATIVE  NEGATIVE Final   MICROSCOPIC NOT DONE ON URINES WITH NEGATIVE PROTEIN, BLOOD, LEUKOCYTES, NITRITE, OR GLUCOSE <1000 mg/dL.  . Coagulation Factor IX 11/23/2014 86  75 - 134 % Final   Performed at [facility]     X-Rays:Dg Chest 2 View  11/23/2014   CLINICAL DATA:  Preoperative exam prior to left total hip surgery; history of asthma and remote history of tobacco use  EXAM: CHEST  2 VIEW  COMPARISON:  None.  FINDINGS: The lungs are adequately inflated. The interstitial markings are mildly increased bilaterally. There is no alveolar  infiltrate. There is no pleural effusion or pneumothorax. The heart and pulmonary vascularity appear normal. The bony thorax exhibits no acute abnormality. Degenerative disc space narrowing at multiple thoracic levels is noted.  IMPRESSION: There is mild hyperinflation consistent with patient's asthma or chronic bronchitis. There is no active cardiopulmonary disease.   Electronically Signed   By: David  Martinique   On:  11/23/2014 15:17   Dg Pelvis Portable  12/01/2014   CLINICAL DATA:  Status post left hip anterior approach replacement  EXAM: PORTABLE PELVIS 1-2 VIEWS  COMPARISON:  None.  FINDINGS: Total left hip arthroplasty without dislocation or periprosthetic fracture.  Advanced degenerative narrowing of the right hip. No evidence of right hip or lower pelvic ring fracture.  IMPRESSION: Unremarkable new left hip arthroplasty.   Electronically Signed   By: Monte Fantasia M.D.   On: 12/01/2014 11:07   Dg C-arm 1-60 Min-no Report  12/01/2014   CLINICAL DATA: surgery   C-ARM 1-60 MINUTES  Fluoroscopy was utilized by the requesting physician.  No radiographic  interpretation.     EKG:No orders found for this or any previous visit.   Hospital Course: Patient was admitted to [facility] and taken to the OR and underwent the above state procedure without complications.  Patient tolerated the procedure well and was later transferred to the recovery room and then to the orthopaedic floor for postoperative care.  They were given PO and IV analgesics for pain control following their surgery.  They were given 24 hours of postoperative antibiotics of  Anti-infectives    Start     Dose/Rate Route Frequency Ordered Stop   12/01/14 1400  ceFAZolin (ANCEF) IVPB 2 g/50 mL premix     2 g 100 mL/hr over 30 Minutes Intravenous Every 6 hours 12/01/14 1158 12/01/14 2157   12/01/14 0639  ceFAZolin (ANCEF) IVPB 2 g/50 mL premix     2 g 100 mL/hr over 30 Minutes Intravenous On call to O.R. 12/01/14 6811 12/01/14  5726     and started on DVT prophylaxis in the form of Xarelto.   PT and OT were ordered for total hip protocol.  The patient was allowed to be WBAT with therapy. Discharge planning was consulted to help with postop disposition and equipment needs.  Patient had a decent night on the evening of surgery.  They started to get up OOB with therapy on day one.  Hemovac drain was pulled without difficulty.  Continued to work with therapy into day two.  Dressing was changed on day two and the incision was healing well.   Patient was seen in rounds and was ready to go home following therapy sessions.  Discharge home with home health following therapy sessions. Diet - Cardiac diet Follow up - in 2 weeks Activity - WBAT Disposition - Home Condition Upon Discharge - Good D/C Meds - See DC Summary DVT Prophylaxis - Xarelto  Discharge Instructions    Call MD / Call 911    Complete by:  As directed   If you experience chest pain or shortness of breath, CALL 911 and be transported to the hospital emergency room.  If you develope a fever above 101 F, pus (white drainage) or increased drainage or redness at the wound, or calf pain, call your surgeon's office.     Change dressing    Complete by:  As directed   You may change your dressing dressing daily with sterile 4 x 4 inch gauze dressing and paper tape.  Do not submerge the incision under water.     Constipation Prevention    Complete by:  As directed   Drink plenty of fluids.  Prune juice may be helpful.  You may use a stool softener, such as Colace (over the counter) 100 mg twice a day.  Use MiraLax (over the counter) for constipation as needed.  Diet - low sodium heart healthy    Complete by:  As directed      Discharge instructions    Complete by:  As directed   Pick up stool softner and laxative for home use following surgery while on pain medications. Do not submerge incision under water. Please use good hand washing techniques while changing  dressing each day. May shower starting three days after surgery. Please use a clean towel to pat the incision dry following showers. Continue to use ice for pain and swelling after surgery. Do not use any lotions or creams on the incision until instructed by your surgeon.  Total Hip Protocol.  Take Xarelto for two and a half more weeks, then discontinue Xarelto. Once the patient has completed the Xarelto, they may resume the 81 mg Aspirin.  Postoperative Constipation Protocol  Constipation - defined medically as fewer than three stools per week and severe constipation as less than one stool per week.  One of the most common issues patients have following surgery is constipation.  Even if you have a regular bowel pattern at home, your normal regimen is likely to be disrupted due to multiple reasons following surgery.  Combination of anesthesia, postoperative narcotics, change in appetite and fluid intake all can affect your bowels.  In order to avoid complications following surgery, here are some recommendations in order to help you during your recovery period.  Colace (docusate) - Pick up an over-the-counter form of Colace or another stool softener and take twice a day as long as you are requiring postoperative pain medications.  Take with a full glass of water daily.  If you experience loose stools or diarrhea, hold the colace until you stool forms back up.  If your symptoms do not get better within 1 week or if they get worse, check with your doctor.  Dulcolax (bisacodyl) - Pick up over-the-counter and take as directed by the product packaging as needed to assist with the movement of your bowels.  Take with a full glass of water.  Use this product as needed if not relieved by Colace only.   MiraLax (polyethylene glycol) - Pick up over-the-counter to have on hand.  MiraLax is a solution that will increase the amount of water in your bowels to assist with bowel movements.  Take as directed and  can mix with a glass of water, juice, soda, coffee, or tea.  Take if you go more than two days without a movement. Do not use MiraLax more than once per day. Call your doctor if you are still constipated or irregular after using this medication for 7 days in a row.  If you continue to have problems with postoperative constipation, please contact the office for further assistance and recommendations.  If you experience "the worst abdominal pain ever" or develop nausea or vomiting, please contact the office immediatly for further recommendations for treatment.     Do not sit on low chairs, stoools or toilet seats, as it may be difficult to get up from low surfaces    Complete by:  As directed      Driving restrictions    Complete by:  As directed   No driving until released by the physician.     Increase activity slowly as tolerated    Complete by:  As directed      Lifting restrictions    Complete by:  As directed   No lifting until released by the physician.     Patient may  shower    Complete by:  As directed   You may shower without a dressing once there is no drainage.  Do not wash over the wound.  If drainage remains, do not shower until drainage stops.     TED hose    Complete by:  As directed   Use stockings (TED hose) for 3 weeks on both leg(s).  You may remove them at night for sleeping.     Weight bearing as tolerated    Complete by:  As directed   Laterality:  left  Extremity:  Lower            Medication List    STOP taking these medications        aspirin EC 81 MG tablet     celecoxib 200 MG capsule  Commonly known as:  CELEBREX     Coenzyme Q10 200 MG capsule     estradiol 1 MG tablet  Commonly known as:  ESTRACE     vitamin B-12 1000 MCG tablet  Commonly known as:  CYANOCOBALAMIN     Vitamin D 2000 UNITS tablet      TAKE these medications        acetaminophen 500 MG tablet  Commonly known as:  TYLENOL  Take 500 mg by mouth every 6 (six) hours as  needed for mild pain or moderate pain.     desonide 0.05 % cream  Commonly known as:  DESOWEN  Apply 1 application topically 2 (two) times daily as needed (for rash).     esomeprazole 40 MG capsule  Commonly known as:  NEXIUM  Take 40 mg by mouth daily at 12 noon.     fluticasone 50 MCG/ACT nasal spray  Commonly known as:  FLONASE  Place 2 sprays into both nostrils 2 (two) times daily.     levothyroxine 150 MCG tablet  Commonly known as:  SYNTHROID, LEVOTHROID  Take 100 mcg by mouth daily before breakfast.     methocarbamol 500 MG tablet  Commonly known as:  ROBAXIN  Take 1 tablet (500 mg total) by mouth every 6 (six) hours as needed for muscle spasms.     montelukast 10 MG tablet  Commonly known as:  SINGULAIR  Take 10 mg by mouth at bedtime.     oxyCODONE 5 MG immediate release tablet  Commonly known as:  Oxy IR/ROXICODONE  Take 1-2 tablets (5-10 mg total) by mouth every 3 (three) hours as needed for moderate pain, severe pain or breakthrough pain.     pravastatin 20 MG tablet  Commonly known as:  PRAVACHOL  Take 20 mg by mouth daily.     rivaroxaban 10 MG Tabs tablet  Commonly known as:  XARELTO  - Take 1 tablet (10 mg total) by mouth daily with breakfast. Take Xarelto for two and a half more weeks, then discontinue Xarelto.  - Once the patient has completed the Xarelto, they may resume the 81 mg Aspirin.     traMADol 50 MG tablet  Commonly known as:  ULTRAM  Take 1-2 tablets (50-100 mg total) by mouth every 6 (six) hours as needed (mild pain).           Follow-up Information    Follow up with [facility] .   Why:  home health physical and occupational therapy   Contact information:   [phone]      Follow up with [facility].   Why:  3n1 (commode)  Contact information:   [address] [place] [address] [phone]       Follow up with Gearlean Alf, MD. Schedule an appointment as soon as possible for a visit  on 12/15/2014.   Specialty:  Orthopedic Surgery   Why:  Call office at [phone] to setup appointment with Dr. Wynelle Link on [facility] 12/15/2014   Contact information:   [address] [place] [address] [phone]       Signed: Arlee Muslim, PA-C Orthopaedic Surgery 12/03/2014, 9:22 AM

## 2014-12-03 NOTE — Progress Notes (Signed)
Pt on CPAP QHS.  Pt using her home machine and equipment.  Sterile water provided to the Pt.  RT to monitor as needed.

## 2014-12-03 NOTE — Progress Notes (Signed)
Physical Therapy Treatment Patient Details Name: Michaela Morris MRN: [id number] DOB: Aug 06, 1946 Today's Date: 12/03/2014    History of Present Illness L THR    PT Comments    Pt progressing well.  Reviewed in/out bed and stairs.  Follow Up Recommendations  Home health PT     Equipment Recommendations  None recommended by PT    Recommendations for Other Services OT consult     Precautions / Restrictions Precautions Precautions: Fall Restrictions Weight Bearing Restrictions: No Other Position/Activity Restrictions: WBAT    Mobility  Bed Mobility Overal bed mobility: Needs Assistance Bed Mobility: Supine to Sit;Sit to Supine     Supine to sit: Min guard Sit to supine: Min guard   General bed mobility comments: cues for sequence and use of R LE to self assist  Transfers Overall transfer level: Needs assistance Equipment used: Rolling walker (2 wheeled) Transfers: Sit to/from Stand Sit to Stand: Supervision         General transfer comment: min cues  Ambulation/Gait Ambulation/Gait assistance: Min guard;Supervision Ambulation Distance (Feet): 350 Feet Assistive device: Rolling walker (2 wheeled) Gait Pattern/deviations: Step-to pattern;Step-through pattern;Decreased step length - right;Decreased step length - left;Shuffle;Trunk flexed Gait velocity: decr   General Gait Details: cues for posture, position from RW and initial sequence   Stairs Stairs: Yes Stairs assistance: Min assist Stair Management: No rails;Two rails;Step to pattern;Backwards;Forwards;With walker Number of Stairs: 5 General stair comments: single step fwd and bkwd with RW; 3 steps with Bil rails  Wheelchair Mobility    Modified Rankin (Stroke Patients Only)       Balance                                    Cognition Arousal/Alertness: Awake/alert Behavior During Therapy: WFL for tasks assessed/performed Overall Cognitive Status: Within Functional Limits for  tasks assessed                      Exercises Total Joint Exercises Ankle Circles/Pumps: AROM;Left;15 reps;Supine Quad Sets: AROM;Both;10 reps;Supine Gluteal Sets: AROM;Both;10 reps;Supine Heel Slides: AAROM;Left;Supine;20 reps Hip ABduction/ADduction: AAROM;Left;Supine;20 reps    General Comments General comments (skin integrity, edema, etc.): min guard to pull up pants, underwear      Pertinent Vitals/Pain Pain Assessment: 0-10 Pain Score: 3  Pain Location: L hip/thigh Pain Descriptors / Indicators: Aching Pain Intervention(s): Limited activity within patient's tolerance;Monitored during session;Premedicated before session;Ice applied    Home Living                      Prior Function            PT Goals (current goals can now be found in the care plan section) Acute Rehab PT Goals Patient Stated Goal: Resume previous lifestyle with decreased pain PT Goal Formulation: With patient Time For Goal Achievement: 12/08/14 Potential to Achieve Goals: Good Progress towards PT goals: Progressing toward goals    Frequency  7X/week    PT Plan Current plan remains appropriate    Co-evaluation             End of Session Equipment Utilized During Treatment: Gait belt Activity Tolerance: Patient tolerated treatment well Patient left: in bed;with call bell/phone within reach     Time: 1047-1130 PT Time Calculation (min) (ACUTE ONLY): 43 min  Charges:  $Gait Training: 8-22 mins $Therapeutic Exercise: 8-22 mins $Therapeutic Activity: 8-22 mins  G Codes:      Michaela Morris 12/03/2014, 1:01 PM

## 2014-12-03 NOTE — Progress Notes (Signed)
   Subjective: 2 Days Post-Op Procedure(s) (LRB): LEFT TOTAL HIP ARTHROPLASTY ANTERIOR APPROACH (Left) Patient reports pain as mild and moderate last night but a little better this morning. Patient seen in rounds with [name]. Patient is well, but has had some minor complaints of pain in the hip, requiring pain medications Patient is ready to go home later today after two therapy sessions.  Objective: Vital signs in last 24 hours: Temp:  [98.1 F (36.7 C)-100.3 F (37.9 C)] 98.1 F (36.7 C) (02/19 0628) Pulse Rate:  [64-89] 64 (02/19 0628) Resp:  [16-20] 16 (02/19 0753) BP: (134-140)/(50-77) 134/50 mmHg (02/19 0628) SpO2:  [94 %-99 %] 95 % (02/19 0628)  Intake/Output from previous day:  Intake/Output Summary (Last 24 hours) at 12/03/14 0913 Last data filed at 12/03/14 0851  Gross per 24 hour  Intake    600 ml  Output   3175 ml  Net  -2575 ml    Intake/Output this shift: Total I/O In: 240 [P.O.:240] Out: 900 [Urine:900]  Labs:  Recent Labs  12/02/14 0430 12/03/14 0433  HGB 9.9* 10.0*    Recent Labs  12/02/14 0430 12/03/14 0433  WBC 12.9* 18.4*  RBC 2.44* 2.49*  HCT 29.3* 29.7*  PLT 207 208    Recent Labs  12/02/14 0430 12/03/14 0433  NA 139 136  K 4.0 3.8  CL 102 104  CO2 28 28  BUN 8 11  CREATININE 0.44* 0.50  GLUCOSE 138* 134*  CALCIUM 8.6 8.4   No results for input(s): LABPT, INR in the last 72 hours.  EXAM: General - Patient is Alert, Appropriate and Oriented Extremity - Neurovascular intact Sensation intact distally Dorsiflexion/Plantar flexion intact Incision - clean, dry, no drainage, healing Motor Function - intact, moving foot and toes well on exam.   Assessment/Plan: 2 Days Post-Op Procedure(s) (LRB): LEFT TOTAL HIP ARTHROPLASTY ANTERIOR APPROACH (Left) Procedure(s) (LRB): LEFT TOTAL HIP ARTHROPLASTY ANTERIOR APPROACH (Left) Past Medical History  Diagnosis Date  . Spondylolisthesis     hx of   . Degenerative lumbar disc    . Bursitis of left hip   . Sleep apnea     uses CPAP  . Shortness of breath dyspnea     walking distances or climbing stairs  . Bronchitis     hx of   . Hypothyroidism   . GERD (gastroesophageal reflux disease)   . Hypercholesteremia   . Asthma   . Osteoarthritis     left knee/left hip   . 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   Weight as of this encounter: 101.152 kg (223 lb). Up with therapy Discharge home with home health following therapy sessions. Diet - Cardiac diet Follow up - in 2 weeks Activity - WBAT Disposition - Home Condition Upon Discharge - Good D/C Meds - See DC Summary DVT Prophylaxis - Xarelto  Michaela Peacerew Maddix Heinz, PA-C Orthopaedic Surgery 12/03/2014, 9:13 AM

## 2015-09-01 ENCOUNTER — Ambulatory Visit: Payer: Self-pay | Admitting: Orthopedic Surgery

## 2015-09-03 ENCOUNTER — Ambulatory Visit: Payer: Self-pay | Admitting: Orthopedic Surgery

## 2015-09-03 NOTE — Progress Notes (Signed)
Preoperative surgical orders have been place into the Epic hospital system for Wynn BankerLinda Abila on 09/03/2015, 4:28 PM  by Patrica DuelPERKINS, ALEXZANDREW for surgery on 09-21-2015.  Preop Total Hip - Anterior Approach orders including IV Tylenol, and IV Decadron as long as there are no contraindications to the above medications. Avel Peacerew Perkins, PA-C

## 2015-09-12 NOTE — Progress Notes (Signed)
[facility]23/2014[id number]

## 2015-09-12 NOTE — Patient Instructions (Addendum)
Michaela Morris  09/12/2015   Your procedure is scheduled on: Wednesday 09/21/2015  Report to [facility]  Entrance [place]  elevators to 3rd floor to  Short Stay Center at  0845 AM.  Call this number if you have problems the morning of surgery [phone]   Remember: ONLY 1 PERSON MAY GO WITH YOU TO SHORT STAY TO GET  READY MORNING OF YOUR SURGERY.   Do not eat food or drink liquids :After Midnight.   BRING CPAP MASK AND TUBING WITH YOU MORNING OF SURGERY!   Take these medicines the morning of surgery with A SIP OF WATER: Levothyroxine, use Flonase nasal spray if needed, NEXIUM  DO NOT TAKE ANY DIABETIC MEDICATIONS DAY OF YOUR SURGERY                               You may not have any metal on your body including hair pins and              piercings  Do not wear jewelry, make-up, lotions, powders or perfumes, deodorant             Do not wear nail polish.  Do not shave  48 hours prior to surgery.              Men may shave face and neck.   Do not bring valuables to the hospital. [place] IS NOT             RESPONSIBLE   FOR VALUABLES.  Contacts, dentures or bridgework may not be worn into surgery.  Leave suitcase in the car. After surgery it may be brought to your room.     Patients discharged the day of surgery will not be allowed to drive home.  Name and phone number of your driver:  Special Instructions: N/A              Please read over the following fact sheets you were given: _____________________________________________________________________             [facility] Pre[facility] Before surgery, you can play an important role.  Because skin is not sterile, your skin needs to be as free of germs as possible.  You can reduce the number of germs on your skin by washing with CHG (chlorahexidine gluconate) soap before surgery.  CHG is an antiseptic cleaner which kills germs and bonds with the skin to continue killing germs even  after washing. Please DO NOT use if you have an allergy to CHG or antibacterial soaps.  If your skin becomes reddened/irritated stop using the CHG and inform your nurse when you arrive at Short Stay. Do not shave (including legs and underarms) for at least 48 hours prior to the first CHG shower.  You may shave your face/neck. Please follow these instructions carefully:  1.  Shower with CHG Soap the night before surgery and the  morning of Surgery.  2.  If you choose to wash your hair, wash your hair first as usual with your  normal  shampoo.  3.  After you shampoo, rinse your hair and body thoroughly to remove the  shampoo.                           4.  Use CHG as  you would any other liquid soap.  You can apply chg directly  to the skin and wash                       Gently with a scrungie or clean washcloth.  5.  Apply the CHG Soap to your body ONLY FROM THE NECK DOWN.   Do not use on face/ open                           Wound or open sores. Avoid contact with eyes, ears mouth and genitals (private parts).                       Wash face,  Genitals (private parts) with your normal soap.             6.  Wash thoroughly, paying special attention to the area where your surgery  will be performed.  7.  Thoroughly rinse your body with warm water from the neck down.  8.  DO NOT shower/wash with your normal soap after using and rinsing off  the CHG Soap.                9.  Pat yourself dry with a clean towel.            10.  Wear clean pajamas.            11.  Place clean sheets on your bed the night of your first shower and do not  sleep with pets. Day of Surgery : Do not apply any lotions/deodorants the morning of surgery.  Please wear clean clothes to the hospital/surgery center.  FAILURE TO FOLLOW THESE INSTRUCTIONS MAY RESULT IN THE CANCELLATION OF YOUR SURGERY PATIENT SIGNATURE_________________________________  NURSE  SIGNATURE__________________________________  ________________________________________________________________________   Adam Phenix  An incentive spirometer is a tool that can help keep your lungs clear and active. This tool measures how well you are filling your lungs with each breath. Taking long deep breaths may help reverse or decrease the chance of developing breathing (pulmonary) problems (especially infection) following:  A long period of time when you are unable to move or be active. BEFORE THE PROCEDURE   If the spirometer includes an indicator to show your best effort, your nurse or respiratory therapist will set it to a desired goal.  If possible, sit up straight or lean slightly forward. Try not to slouch.  Hold the incentive spirometer in an upright position. INSTRUCTIONS FOR USE   Sit on the edge of your bed if possible, or sit up as far as you can in bed or on a chair.  Hold the incentive spirometer in an upright position.  Breathe out normally.  Place the mouthpiece in your mouth and seal your lips tightly around it.  Breathe in slowly and as deeply as possible, raising the piston or the ball toward the top of the column.  Hold your breath for 3-5 seconds or for as long as possible. Allow the piston or ball to fall to the bottom of the column.  Remove the mouthpiece from your mouth and breathe out normally.  Rest for a few seconds and repeat Steps 1 through 7 at least 10 times every 1-2 hours when you are awake. Take your time and take a few normal breaths between deep breaths.  The spirometer may include an indicator to show your best effort. Use the  indicator as a goal to work toward during each repetition.  After each set of 10 deep breaths, practice coughing to be sure your lungs are clear. If you have an incision (the cut made at the time of surgery), support your incision when coughing by placing a pillow or rolled up towels firmly against it. Once  you are able to get out of bed, walk around indoors and cough well. You may stop using the incentive spirometer when instructed by your caregiver.  RISKS AND COMPLICATIONS  Take your time so you do not get dizzy or light-headed.  If you are in pain, you may need to take or ask for pain medication before doing incentive spirometry. It is harder to take a deep breath if you are having pain. AFTER USE  Rest and breathe slowly and easily.  It can be helpful to keep track of a log of your progress. Your caregiver can provide you with a simple table to help with this. If you are using the spirometer at home, follow these instructions: [place] IF:   You are having difficultly using the spirometer.  You have trouble using the spirometer as often as instructed.  Your pain medication is not giving enough relief while using the spirometer.  You develop fever of 100.5 F (38.1 C) or higher. SEEK IMMEDIATE MEDICAL CARE IF:   You cough up bloody sputum that had not been present before.  You develop fever of 102 F (38.9 C) or greater.  You develop worsening pain at or near the incision site. MAKE SURE YOU:   Understand these instructions.  Will watch your condition.  Will get help right away if you are not doing well or get worse. Document Released: 02/11/2007 Document Revised: 12/24/2011 Document Reviewed: 04/14/2007 ExitCare Patient Information 2014 ExitCare, [place].   ________________________________________________________________________  WHAT IS A BLOOD TRANSFUSION? Blood Transfusion Information  A transfusion is the replacement of blood or some of its parts. Blood is made up of multiple cells which provide different functions.  Red blood cells carry oxygen and are used for blood loss replacement.  White blood cells fight against infection.  Platelets control bleeding.  Plasma helps clot blood.  Other blood products are available for specialized needs, such as  hemophilia or other clotting disorders. BEFORE THE TRANSFUSION  Who gives blood for transfusions?   Healthy volunteers who are fully evaluated to make sure their blood is safe. This is blood bank blood. Transfusion therapy is the safest it has ever been in the practice of medicine. Before blood is taken from a donor, a complete history is taken to make sure that person has no history of diseases nor engages in risky social behavior (examples are intravenous drug use or sexual activity with multiple partners). The donor's travel history is screened to minimize risk of transmitting infections, such as malaria. The donated blood is tested for signs of infectious diseases, such as HIV and hepatitis. The blood is then tested to be sure it is compatible with you in order to minimize the chance of a transfusion reaction. If you or a relative donates blood, this is often done in anticipation of surgery and is not appropriate for emergency situations. It takes many days to process the donated blood. RISKS AND COMPLICATIONS Although transfusion therapy is very safe and saves many lives, the main dangers of transfusion include:   Getting an infectious disease.  Developing a transfusion reaction. This is an allergic reaction to something in the blood you  were given. Every precaution is taken to prevent this. The decision to have a blood transfusion has been considered carefully by your caregiver before blood is given. Blood is not given unless the benefits outweigh the risks. AFTER THE TRANSFUSION  Right after receiving a blood transfusion, you will usually feel much better and more energetic. This is especially true if your red blood cells have gotten low (anemic). The transfusion raises the level of the red blood cells which carry oxygen, and this usually causes an energy increase.  The nurse administering the transfusion will monitor you carefully for complications. HOME CARE INSTRUCTIONS  No special  instructions are needed after a transfusion. You may find your energy is better. Speak with your caregiver about any limitations on activity for underlying diseases you may have. SEEK MEDICAL CARE IF:   Your condition is not improving after your transfusion.  You develop redness or irritation at the intravenous (IV) site. SEEK IMMEDIATE MEDICAL CARE IF:  Any of the following symptoms occur over the next 12 hours:  Shaking chills.  You have a temperature by mouth above 102 F (38.9 C), not controlled by medicine.  Chest, back, or muscle pain.  People around you feel you are not acting correctly or are confused.  Shortness of breath or difficulty breathing.  Dizziness and fainting.  You get a rash or develop hives.  You have a decrease in urine output.  Your urine turns a dark color or changes to pink, red, or brown. Any of the following symptoms occur over the next 10 days:  You have a temperature by mouth above 102 F (38.9 C), not controlled by medicine.  Shortness of breath.  Weakness after normal activity.  The white part of the eye turns yellow (jaundice).  You have a decrease in the amount of urine or are urinating less often.  Your urine turns a dark color or changes to pink, red, or brown. Document Released: 09/28/2000 Document Revised: 12/24/2011 Document Reviewed: 05/17/2008 [facility] Patient Information 2014 [place], [place].  _______________________________________________________________________

## 2015-09-13 ENCOUNTER — Other Ambulatory Visit (HOSPITAL_COMMUNITY): Payer: Self-pay | Admitting: *Deleted

## 2015-09-14 ENCOUNTER — Encounter (HOSPITAL_COMMUNITY)
Admission: RE | Admit: 2015-09-14 | Discharge: 2015-09-14 | Disposition: A | Discharge status: Home / Self Care | Payer: Medicare Other | Source: Ambulatory Visit | Attending: Orthopedic Surgery | Admitting: Orthopedic Surgery

## 2015-09-14 ENCOUNTER — Encounter (HOSPITAL_COMMUNITY): Payer: Self-pay

## 2015-09-14 DIAGNOSIS — Z01812 Encounter for preprocedural laboratory examination: Secondary | ICD-10-CM | POA: Insufficient documentation

## 2015-09-14 DIAGNOSIS — M1611 Unilateral primary osteoarthritis, right hip: Secondary | ICD-10-CM | POA: Insufficient documentation

## 2015-09-14 DIAGNOSIS — Z01810 Encounter for preprocedural cardiovascular examination: Secondary | ICD-10-CM | POA: Insufficient documentation

## 2015-09-14 LAB — COMPREHENSIVE METABOLIC PANEL
ALT: 20 U/L (ref 14–54)
AST: 24 U/L (ref 15–41)
Albumin: 3.7 g/dL (ref 3.5–5.0)
Alkaline Phosphatase: 84 U/L (ref 38–126)
Anion gap: 6 (ref 5–15)
BUN: 12 mg/dL (ref 6–20)
CHLORIDE: 105 mmol/L (ref 101–111)
CO2: 26 mmol/L (ref 22–32)
CREATININE: 0.64 mg/dL (ref 0.44–1.00)
Calcium: 8.8 mg/dL — ABNORMAL LOW (ref 8.9–10.3)
Glucose, Bld: 155 mg/dL — ABNORMAL HIGH (ref 65–99)
POTASSIUM: 3.8 mmol/L (ref 3.5–5.1)
Sodium: 137 mmol/L (ref 135–145)
Total Bilirubin: 0.3 mg/dL (ref 0.3–1.2)
Total Protein: 7.2 g/dL (ref 6.5–8.1)

## 2015-09-14 LAB — CBC
HCT: 40.1 % (ref 36.0–46.0)
Hemoglobin: 12.8 g/dL (ref 12.0–15.0)
MCH: 26.6 pg (ref 26.0–34.0)
MCHC: 31.9 g/dL (ref 30.0–36.0)
MCV: 83.4 fL (ref 78.0–100.0)
PLATELETS: 266 10*3/uL (ref 150–400)
RBC: 4.81 MIL/uL (ref 3.87–5.11)
RDW: 16.1 % — AB (ref 11.5–15.5)
WBC: 9 10*3/uL (ref 4.0–10.5)

## 2015-09-14 LAB — URINALYSIS, ROUTINE W REFLEX MICROSCOPIC
BILIRUBIN URINE: NEGATIVE
Glucose, UA: NEGATIVE mg/dL
Hgb urine dipstick: NEGATIVE
KETONES UR: NEGATIVE mg/dL
LEUKOCYTES UA: NEGATIVE
Nitrite: NEGATIVE
PH: 6 (ref 5.0–8.0)
Protein, ur: NEGATIVE mg/dL
Specific Gravity, Urine: 1.012 (ref 1.005–1.030)

## 2015-09-14 LAB — PROTIME-INR
INR: 0.93 (ref 0.00–1.49)
PROTHROMBIN TIME: 12.7 s (ref 11.6–15.2)

## 2015-09-14 LAB — SURGICAL PCR SCREEN
MRSA, PCR: NEGATIVE
Staphylococcus aureus: NEGATIVE

## 2015-09-14 LAB — APTT: aPTT: 32 seconds (ref 24–37)

## 2015-09-14 NOTE — Progress Notes (Signed)
Patient being seen in PAT for Right total hip arthroplasty on 09/20/15 with Dr. Despina HickAlusio.  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  She states her levels fluctuate, but she has never had a surgical complication of bleeding.  She does not currently have a hematologist.  PAT nurse called Dr. Despina HickAlusio who agreed to order Factor 9 assay.  Discussed with patient possible need for hematology consult should lab results be abnormal.  Patient expressed understanding.  Arrie AranStephen Turk, MD Anesthesiology

## 2015-09-14 NOTE — Progress Notes (Signed)
08/23/2015-Myocardial Perfusion Imaging test [facility] on chart.

## 2015-09-19 ENCOUNTER — Ambulatory Visit: Payer: Self-pay | Admitting: Orthopedic Surgery

## 2015-09-19 NOTE — H&P (Signed)
Michaela Morris DOB: 1946/02/08 Married / Language: English / Race: White Female Date of Admission:  09/21/2015 CC:  Right Hip Pain History of Present Illness The patient is a 69 year old female who comes in  for a preoperative History and Physical. The patient is scheduled for a right total hip arthroplasty (anterior) to be performed by Dr. Gus RankinFrank V. Aluisio, MD at [facility] on 09-21-2015. She has already had the left hip replaced. The left hip is doing extremely well at this time. The right hip is what has given her the most trouble. She has had an injection and did not have tremendous amount of benefit with it. She is at a stage now she feels that the right hip needs to be fixed. On the right, she has got severe bone-on-bone arthritis with subchondral cystic formation. She has got advanced arthritic change of that right hip. She wants to go head and get scheduled for hip replacement. They have been treated conservatively in the past for the above stated problem and despite conservative measures, they continue to have progressive pain and severe functional limitations and dysfunction. They have failed non-operative management including home exercise, medications, and injections. It is felt that they would benefit from undergoing total joint replacement. Risks and benefits of the procedure have been discussed with the patient and they elect to proceed with surgery. There are no active contraindications to surgery such as ongoing infection or rapidly progressive neurological disease.  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  Family History  Chronic Obstructive Lung Disease  brother Rheumatoid Arthritis  Maternal Grandfather. Osteoarthritis  mother Bleeding disorder  brother Osteoporosis  mother and grandmother fathers side Heart Disease  mother, father, brother, grandmother mothers side and grandfather mothers side Cancer  mother and father First Degree Relatives  reported Congestive Heart Failure  Maternal Grandmother, Paternal Grandmother. Diabetes Mellitus  Father. Hypertension  mother and father Cerebrovascular Accident  grandmother fathers side Heart disease in female family member before age 69   Social History Current drinker  04/21/2014: Currently drinks wine only occasionally per week Drug/Alcohol Rehab (Previously)  no Tobacco use  04/21/2014 former smoker; smoke(d) less than 1/2 pack(s) per day Living situation  live alone Exercise  Exercises weekly; does other Exercises daily; does other Advance Directives  Living Will, Healthcare POA Tobacco / smoke exposure  04/21/2014: no no Illicit drug use  no Marital status  married Number of flights of stairs before winded  less than 1 1 Alcohol use  current drinker; drinks wine; only occasionally per week Current work status  retired Financial plannerDrug/Alcohol Rehab (Currently)  no No history of drug/alcohol rehab  Not under pain contract  Pain Contract  no Children  3  Medication History Vitamin B-12 ER (Oral) Specific dose unknown - Active. (sublingual and injection) Aspirin (81MG  Tablet, 1 (one) Oral) Active. Vitamin D (1 (one) Oral) Specific dose unknown - Active. Desonide (0.05% Cream,  External) Active. Hydrochlorothiazide (12.5MG  Tablet, Oral) Active. Pravastatin Sodium (  Tablet, Oral) Active. Synthroid ( Tablet, Oral) Active. Fluticasone Propionate (50MCG/ACT Suspension, Nasal) Active. Levothyroxine Sodium ( Capsule, Oral) Active. Estradiol (  Tablet, Oral) Active. Montelukast Sodium (  Tablet, Oral) Active. (Singulair) NexIUM (  Capsule DR, Oral) Active. celebrex Active.   Past Surgical History  Colon Polyp Removal - Colonoscopy  Breast Biopsy  left Hysterectomy  Date: 1989. complete (non-cancerous) Rotator Cuff Repair  right Arthroscopy of Shoulder  right Dilation and Curettage of Uterus  Total Hip Replacement - Left 12/01/2014   Review of Systems General Not Present- Chills, Fatigue, Fever, Memory Loss, Night Sweats, Weight Gain and Weight Loss. Skin Not Present- Eczema, Hives, Itching, Lesions and Rash. HEENT Not Present- Dentures, Double Vision, Headache, Hearing Loss, Tinnitus and Visual Loss. Respiratory Not Present- Allergies, Chronic Cough, Coughing up blood, Shortness of breath at rest and Shortness of breath with exertion. Cardiovascular Not Present- Chest Pain, Difficulty Breathing Lying Down, Murmur, Palpitations, Racing/skipping heartbeats and Swelling. Gastrointestinal Not Present- Abdominal Pain, Bloody Stool, Constipation, Diarrhea, Difficulty Swallowing, Heartburn, Jaundice, Loss of appetitie, Nausea and Vomiting. Female Genitourinary Present- Urinary frequency. Not Present- Blood in Urine, Discharge, Flank Pain, Incontinence, Painful Urination, Urgency, Urinary Retention, Urinating at Night and Weak urinary stream. Musculoskeletal Present- Morning Stiffness. Not Present- Back Pain, Joint Pain, Joint Swelling, Muscle Pain, Muscle Weakness and Spasms. Neurological Not Present- Blackout spells, Difficulty with balance, Dizziness, Paralysis, Tremor and Weakness. Psychiatric Not Present-  Insomnia.  Vitals Weight: 230 lb Height: 61in Weight was reported by patient. Height was reported by patient. Body Surface Area: 2 m Body Mass Index: 43.46 kg/m  BP: 148/72 (Sitting, Right Arm, Standard)   Physical Exam General Mental Status -Alert, cooperative and good historian. General Appearance-pleasant, Not in acute distress. Orientation-Oriented X3. Build & Nutrition-Well nourished and Well developed.  Head and Neck Head-normocephalic, atraumatic . Neck Global Assessment - supple, no bruit auscultated on the right, no bruit auscultated on the left.  Eye Pupil - Bilateral-Regular and Round. Motion - Bilateral-EOMI.  Chest and Lung Exam Auscultation Breath sounds - clear at anterior chest wall and clear at posterior chest wall. Adventitious sounds - No Adventitious sounds.  Cardiovascular Auscultation Rhythm - Regular rate and rhythm. Heart Sounds - S1 WNL and S2 WNL. Murmurs & Other Heart Sounds - Auscultation of the heart reveals - No Murmurs.  Abdomen Palpation/Percussion Tenderness - Abdomen is non-tender to palpation. Rigidity (guarding) - Abdomen is soft. Auscultation Auscultation of the abdomen reveals - Bowel sounds normal.  Female Genitourinary Note: Not done, not pertinent to present illness   Musculoskeletal Note: She is alert and oriented, no apparent distress. Her left hip can be flexed to 110, rotated in 30, out 40, abductor 40 without discomfort. Her scar looks fine. No evidence of any other openings. Right hip flexion 90. No internal or external rotation about 20 degrees of abduction.  RADIOGRAPHS AP pelvis and lateral of the hips show the prosthesis on the left in excellent position with no periprosthetic abnormalities. On the right, she has got severe bone-on-bone arthritis with subchondral cystic formation.    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  PCP: Dr. Theophilus Bones - Patient has been seen preoperatively and felt to be stable for surgery.  IV TXA  Anesthesia Issues: None  Comments: History of bleeding problems  following a D&C procedure  Signed electronically by Lauraine Rinne, III PA-C

## 2015-09-20 LAB — FACTOR 9 ASSAY: COAGULATION FACTOR IX: 70 % (ref 60–177)

## 2015-09-20 NOTE — Progress Notes (Signed)
08/04/2015-[facility] [place] and EKG on chartt from 08/04/2015. 08/23/2015-[facility] nuclear stress test from Dr. Colon BranchAbiodun on chart.

## 2015-09-21 ENCOUNTER — Inpatient Hospital Stay (HOSPITAL_COMMUNITY): Payer: Medicare Other

## 2015-09-21 ENCOUNTER — Inpatient Hospital Stay (HOSPITAL_COMMUNITY)
Admission: RE | Admit: 2015-09-21 | Discharge: 2015-09-23 | DRG: 470 | Disposition: A | Discharge status: Home Health | Payer: Medicare Other | Source: Ambulatory Visit | Attending: Orthopedic Surgery | Admitting: Orthopedic Surgery

## 2015-09-21 ENCOUNTER — Inpatient Hospital Stay (HOSPITAL_COMMUNITY): Payer: Medicare Other | Admitting: Anesthesiology

## 2015-09-21 ENCOUNTER — Encounter (HOSPITAL_COMMUNITY): Payer: Self-pay | Admitting: *Deleted

## 2015-09-21 ENCOUNTER — Encounter (HOSPITAL_COMMUNITY)
Admission: RE | Disposition: A | Discharge status: Home Health | Payer: Self-pay | Source: Ambulatory Visit | Attending: Orthopedic Surgery

## 2015-09-21 DIAGNOSIS — Z6841 Body Mass Index (BMI) 40.0 and over, adult: Secondary | ICD-10-CM | POA: Diagnosis not present

## 2015-09-21 DIAGNOSIS — G8929 Other chronic pain: Secondary | ICD-10-CM | POA: Diagnosis present

## 2015-09-21 DIAGNOSIS — K219 Gastro-esophageal reflux disease without esophagitis: Secondary | ICD-10-CM | POA: Diagnosis present

## 2015-09-21 DIAGNOSIS — E039 Hypothyroidism, unspecified: Secondary | ICD-10-CM | POA: Diagnosis present

## 2015-09-21 DIAGNOSIS — M169 Osteoarthritis of hip, unspecified: Secondary | ICD-10-CM | POA: Diagnosis present

## 2015-09-21 DIAGNOSIS — E7800 Pure hypercholesterolemia, unspecified: Secondary | ICD-10-CM | POA: Diagnosis present

## 2015-09-21 DIAGNOSIS — Z96649 Presence of unspecified artificial hip joint: Secondary | ICD-10-CM

## 2015-09-21 DIAGNOSIS — M1611 Unilateral primary osteoarthritis, right hip: Secondary | ICD-10-CM | POA: Diagnosis present

## 2015-09-21 DIAGNOSIS — Z96642 Presence of left artificial hip joint: Secondary | ICD-10-CM | POA: Diagnosis present

## 2015-09-21 DIAGNOSIS — J45909 Unspecified asthma, uncomplicated: Secondary | ICD-10-CM | POA: Diagnosis present

## 2015-09-21 DIAGNOSIS — Z87891 Personal history of nicotine dependence: Secondary | ICD-10-CM

## 2015-09-21 DIAGNOSIS — G4730 Sleep apnea, unspecified: Secondary | ICD-10-CM | POA: Diagnosis present

## 2015-09-21 DIAGNOSIS — J449 Chronic obstructive pulmonary disease, unspecified: Secondary | ICD-10-CM | POA: Diagnosis present

## 2015-09-21 HISTORY — PX: TOTAL HIP ARTHROPLASTY: SHX124

## 2015-09-21 LAB — TYPE AND SCREEN
ABO/RH(D): O POS
ANTIBODY SCREEN: NEGATIVE

## 2015-09-21 SURGERY — ARTHROPLASTY, HIP, TOTAL, ANTERIOR APPROACH
Anesthesia: Spinal | Site: Hip | Laterality: Right

## 2015-09-21 MED ORDER — METOCLOPRAMIDE HCL 5 MG/ML IJ SOLN: 5.0000 mg | Freq: Three times a day (TID) | INTRAMUSCULAR | Status: DC | PRN

## 2015-09-21 MED ORDER — CEFAZOLIN SODIUM-DEXTROSE 2-3 GM-% IV SOLR
2.0000 g | INTRAVENOUS | Status: AC
  Administered 2015-09-21: 2 g via INTRAVENOUS

## 2015-09-21 MED ORDER — ESTRADIOL 1 MG PO TABS
1.0000 mg | ORAL_TABLET | Freq: Every day | ORAL | Status: DC
  Administered 2015-09-21: 1 mg via ORAL
  Filled 2015-09-21 (×2): qty 1

## 2015-09-21 MED ORDER — PROMETHAZINE HCL 25 MG/ML IJ SOLN: 6.2500 mg | INTRAMUSCULAR | Status: DC | PRN

## 2015-09-21 MED ORDER — SODIUM CHLORIDE 0.9 % IV SOLN
1000.0000 mg | INTRAVENOUS | Status: AC
  Administered 2015-09-21: 1000 mg via INTRAVENOUS
  Filled 2015-09-21: qty 10

## 2015-09-21 MED ORDER — DIPHENHYDRAMINE HCL 12.5 MG/5ML PO ELIX
12.5000 mg | ORAL_SOLUTION | ORAL | Status: DC | PRN
Start: 2015-09-21 — End: 2015-09-23

## 2015-09-21 MED ORDER — BUPIVACAINE HCL (PF) 0.5 % IJ SOLN
INTRAMUSCULAR | Status: AC
  Filled 2015-09-21: qty 30

## 2015-09-21 MED ORDER — BUPIVACAINE HCL (PF) 0.5 % IJ SOLN
INTRAMUSCULAR | Status: DC | PRN
  Administered 2015-09-21: 15 mg

## 2015-09-21 MED ORDER — DEXAMETHASONE SODIUM PHOSPHATE 10 MG/ML IJ SOLN: 10.0000 mg | Freq: Once | INTRAMUSCULAR | Status: DC

## 2015-09-21 MED ORDER — ACETAMINOPHEN 650 MG RE SUPP: 650.0000 mg | Freq: Four times a day (QID) | RECTAL | Status: DC | PRN

## 2015-09-21 MED ORDER — ONDANSETRON HCL 4 MG PO TABS
4.0000 mg | ORAL_TABLET | Freq: Four times a day (QID) | ORAL | Status: DC | PRN
Start: 2015-09-21 — End: 2015-09-23

## 2015-09-21 MED ORDER — FLUTICASONE PROPIONATE 50 MCG/ACT NA SUSP: 2.0000 | Freq: Every day | NASAL | Status: DC | PRN

## 2015-09-21 MED ORDER — METHOCARBAMOL 500 MG PO TABS
500.0000 mg | ORAL_TABLET | Freq: Four times a day (QID) | ORAL | Status: DC | PRN
Start: 2015-09-21 — End: 2015-09-23
  Administered 2015-09-21 – 2015-09-22 (×2): 500 mg via ORAL
  Filled 2015-09-21 (×2): qty 1

## 2015-09-21 MED ORDER — BUPIVACAINE HCL (PF) 0.25 % IJ SOLN
INTRAMUSCULAR | Status: AC
Start: 2015-09-21 — End: 2015-09-21
  Filled 2015-09-21: qty 30

## 2015-09-21 MED ORDER — DEXAMETHASONE SODIUM PHOSPHATE 10 MG/ML IJ SOLN
10.0000 mg | Freq: Once | INTRAMUSCULAR | Status: AC
  Administered 2015-09-22: 10 mg via INTRAVENOUS
  Filled 2015-09-21 (×2): qty 1

## 2015-09-21 MED ORDER — PHENOL 1.4 % MT LIQD: 1.0000 | OROMUCOSAL | Status: DC | PRN

## 2015-09-21 MED ORDER — ONDANSETRON HCL 4 MG/2ML IJ SOLN
INTRAMUSCULAR | Status: AC
  Filled 2015-09-21: qty 2

## 2015-09-21 MED ORDER — TRAMADOL HCL 50 MG PO TABS: 50.0000 mg | ORAL_TABLET | Freq: Four times a day (QID) | ORAL | Status: DC | PRN

## 2015-09-21 MED ORDER — MORPHINE SULFATE (PF) 10 MG/ML IV SOLN: 1.0000 mg | INTRAVENOUS | Status: DC | PRN

## 2015-09-21 MED ORDER — HYDROMORPHONE HCL 1 MG/ML IJ SOLN
INTRAMUSCULAR | Status: AC
  Filled 2015-09-21: qty 1

## 2015-09-21 MED ORDER — MEPERIDINE HCL 50 MG/ML IJ SOLN: 6.2500 mg | INTRAMUSCULAR | Status: DC | PRN

## 2015-09-21 MED ORDER — LEVOTHYROXINE SODIUM 125 MCG PO TABS
125.0000 ug | ORAL_TABLET | Freq: Every day | ORAL | Status: DC
  Administered 2015-09-22 – 2015-09-23 (×2): 125 ug via ORAL
  Filled 2015-09-21 (×3): qty 1

## 2015-09-21 MED ORDER — BUPIVACAINE HCL (PF) 0.25 % IJ SOLN
INTRAMUSCULAR | Status: DC | PRN
  Administered 2015-09-21: 30 mL

## 2015-09-21 MED ORDER — FLEET ENEMA 7-19 GM/118ML RE ENEM: 1.0000 | ENEMA | Freq: Once | RECTAL | Status: DC | PRN

## 2015-09-21 MED ORDER — ONDANSETRON HCL 4 MG/2ML IJ SOLN: 4.0000 mg | Freq: Four times a day (QID) | INTRAMUSCULAR | Status: DC | PRN

## 2015-09-21 MED ORDER — ONDANSETRON HCL 4 MG/2ML IJ SOLN
INTRAMUSCULAR | Status: DC | PRN
  Administered 2015-09-21: 4 mg via INTRAVENOUS

## 2015-09-21 MED ORDER — MIDAZOLAM HCL 2 MG/2ML IJ SOLN
INTRAMUSCULAR | Status: AC
  Filled 2015-09-21: qty 2

## 2015-09-21 MED ORDER — FENTANYL CITRATE (PF) 100 MCG/2ML IJ SOLN
INTRAMUSCULAR | Status: DC | PRN
  Administered 2015-09-21: 100 ug via INTRAVENOUS

## 2015-09-21 MED ORDER — EPHEDRINE SULFATE 50 MG/ML IJ SOLN
INTRAMUSCULAR | Status: DC | PRN
  Administered 2015-09-21: 10 mg via INTRAVENOUS

## 2015-09-21 MED ORDER — BISACODYL 10 MG RE SUPP: 10.0000 mg | Freq: Every day | RECTAL | Status: DC | PRN

## 2015-09-21 MED ORDER — PROPOFOL 10 MG/ML IV BOLUS
INTRAVENOUS | Status: AC
  Filled 2015-09-21: qty 20

## 2015-09-21 MED ORDER — PROPOFOL 500 MG/50ML IV EMUL
INTRAVENOUS | Status: DC | PRN
  Administered 2015-09-21: 100 ug/kg/min via INTRAVENOUS

## 2015-09-21 MED ORDER — MENTHOL 3 MG MT LOZG: 1.0000 | LOZENGE | OROMUCOSAL | Status: DC | PRN

## 2015-09-21 MED ORDER — ACETAMINOPHEN 10 MG/ML IV SOLN
1000.0000 mg | Freq: Once | INTRAVENOUS | Status: AC
  Administered 2015-09-21: 1000 mg via INTRAVENOUS

## 2015-09-21 MED ORDER — ACETAMINOPHEN 500 MG PO TABS
1000.0000 mg | ORAL_TABLET | Freq: Four times a day (QID) | ORAL | Status: AC
  Administered 2015-09-21 – 2015-09-22 (×4): 1000 mg via ORAL
  Filled 2015-09-21 (×4): qty 2

## 2015-09-21 MED ORDER — PRAVASTATIN SODIUM 40 MG PO TABS
40.0000 mg | ORAL_TABLET | Freq: Every day | ORAL | Status: DC
  Administered 2015-09-21: 40 mg via ORAL
  Filled 2015-09-21 (×3): qty 1

## 2015-09-21 MED ORDER — SODIUM CHLORIDE 0.9 % IV SOLN
INTRAVENOUS | Status: DC
  Administered 2015-09-21: 75 mL/h via INTRAVENOUS
  Administered 2015-09-22: 01:00:00 via INTRAVENOUS

## 2015-09-21 MED ORDER — DEXAMETHASONE SODIUM PHOSPHATE 10 MG/ML IJ SOLN
INTRAMUSCULAR | Status: DC | PRN
  Administered 2015-09-21: 10 mg via INTRAVENOUS

## 2015-09-21 MED ORDER — METOCLOPRAMIDE HCL 5 MG PO TABS
5.0000 mg | ORAL_TABLET | Freq: Three times a day (TID) | ORAL | Status: DC | PRN
  Filled 2015-09-21: qty 2

## 2015-09-21 MED ORDER — FENTANYL CITRATE (PF) 250 MCG/5ML IJ SOLN
INTRAMUSCULAR | Status: AC
  Filled 2015-09-21: qty 5

## 2015-09-21 MED ORDER — CEFAZOLIN SODIUM-DEXTROSE 2-3 GM-% IV SOLR
2.0000 g | Freq: Four times a day (QID) | INTRAVENOUS | Status: AC
  Administered 2015-09-21 (×2): 2 g via INTRAVENOUS
  Filled 2015-09-21 (×2): qty 50

## 2015-09-21 MED ORDER — CEFAZOLIN SODIUM-DEXTROSE 2-3 GM-% IV SOLR
INTRAVENOUS | Status: AC
  Filled 2015-09-21: qty 50

## 2015-09-21 MED ORDER — LACTATED RINGERS IV SOLN
INTRAVENOUS | Status: DC
  Administered 2015-09-21: 11:00:00 via INTRAVENOUS
  Administered 2015-09-21: 1000 mL via INTRAVENOUS
  Administered 2015-09-21: 12:00:00 via INTRAVENOUS

## 2015-09-21 MED ORDER — MIDAZOLAM HCL 2 MG/2ML IJ SOLN: 0.5000 mg | Freq: Once | INTRAMUSCULAR | Status: DC | PRN

## 2015-09-21 MED ORDER — ROCURONIUM BROMIDE 100 MG/10ML IV SOLN
INTRAVENOUS | Status: AC
  Filled 2015-09-21: qty 1

## 2015-09-21 MED ORDER — 0.9 % SODIUM CHLORIDE (POUR BTL) OPTIME
TOPICAL | Status: DC | PRN
  Administered 2015-09-21: 1000 mL

## 2015-09-21 MED ORDER — SODIUM CHLORIDE 0.9 % IR SOLN
Status: DC | PRN
  Administered 2015-09-21: 1000 mL

## 2015-09-21 MED ORDER — OXYCODONE HCL 5 MG PO TABS
5.0000 mg | ORAL_TABLET | ORAL | Status: DC | PRN
  Administered 2015-09-21 – 2015-09-23 (×12): 10 mg via ORAL
  Filled 2015-09-21 (×12): qty 2

## 2015-09-21 MED ORDER — METHOCARBAMOL 1000 MG/10ML IJ SOLN
500.0000 mg | Freq: Four times a day (QID) | INTRAVENOUS | Status: DC | PRN
  Administered 2015-09-21: 500 mg via INTRAVENOUS
  Filled 2015-09-21 (×2): qty 5

## 2015-09-21 MED ORDER — POLYETHYLENE GLYCOL 3350 17 G PO PACK: 17.0000 g | PACK | Freq: Every day | ORAL | Status: DC | PRN

## 2015-09-21 MED ORDER — LIDOCAINE HCL (CARDIAC) 20 MG/ML IV SOLN
INTRAVENOUS | Status: AC
  Filled 2015-09-21: qty 5

## 2015-09-21 MED ORDER — HYDROMORPHONE HCL 1 MG/ML IJ SOLN
0.2500 mg | INTRAMUSCULAR | Status: DC | PRN
  Administered 2015-09-21 (×2): 0.5 mg via INTRAVENOUS

## 2015-09-21 MED ORDER — MORPHINE SULFATE (PF) 2 MG/ML IV SOLN: 1.0000 mg | INTRAVENOUS | Status: DC | PRN

## 2015-09-21 MED ORDER — DEXAMETHASONE SODIUM PHOSPHATE 10 MG/ML IJ SOLN
INTRAMUSCULAR | Status: AC
  Filled 2015-09-21: qty 1

## 2015-09-21 MED ORDER — MIDAZOLAM HCL 5 MG/5ML IJ SOLN
INTRAMUSCULAR | Status: DC | PRN
  Administered 2015-09-21: 2 mg via INTRAVENOUS

## 2015-09-21 MED ORDER — DOCUSATE SODIUM 100 MG PO CAPS
100.0000 mg | ORAL_CAPSULE | Freq: Two times a day (BID) | ORAL | Status: DC
  Administered 2015-09-21 – 2015-09-23 (×4): 100 mg via ORAL

## 2015-09-21 MED ORDER — ACETAMINOPHEN 325 MG PO TABS
650.0000 mg | ORAL_TABLET | Freq: Four times a day (QID) | ORAL | Status: DC | PRN
  Administered 2015-09-22: 650 mg via ORAL
  Filled 2015-09-21: qty 2

## 2015-09-21 MED ORDER — MONTELUKAST SODIUM 10 MG PO TABS
10.0000 mg | ORAL_TABLET | Freq: Every day | ORAL | Status: DC
  Administered 2015-09-21 – 2015-09-22 (×2): 10 mg via ORAL
  Filled 2015-09-21 (×3): qty 1

## 2015-09-21 MED ORDER — ACETAMINOPHEN 10 MG/ML IV SOLN
INTRAVENOUS | Status: AC
  Filled 2015-09-21: qty 100

## 2015-09-21 MED ORDER — PANTOPRAZOLE SODIUM 40 MG PO TBEC: 80.0000 mg | DELAYED_RELEASE_TABLET | Freq: Every day | ORAL | Status: DC

## 2015-09-21 MED ORDER — RIVAROXABAN 10 MG PO TABS
10.0000 mg | ORAL_TABLET | Freq: Every day | ORAL | Status: DC
  Administered 2015-09-22 – 2015-09-23 (×2): 10 mg via ORAL
  Filled 2015-09-21 (×3): qty 1

## 2015-09-21 SURGICAL SUPPLY — 34 items
BAG DECANTER FOR FLEXI CONT (MISCELLANEOUS) ×3 IMPLANT
BAG ZIPLOCK 12X15 (MISCELLANEOUS) IMPLANT
BLADE SAG 18X100X1.27 (BLADE) ×3 IMPLANT
CAPT HIP TOTAL 2 ×3 IMPLANT
CLOSURE WOUND 1/2 X4 (GAUZE/BANDAGES/DRESSINGS) ×1
CLOTH BEACON ORANGE TIMEOUT ST (SAFETY) ×3 IMPLANT
COVER PERINEAL POST (MISCELLANEOUS) ×3 IMPLANT
DECANTER SPIKE VIAL GLASS SM (MISCELLANEOUS) ×3 IMPLANT
DRAPE STERI IOBAN 125X83 (DRAPES) ×3 IMPLANT
DRAPE U-SHAPE 47X51 STRL (DRAPES) ×6 IMPLANT
DRSG ADAPTIC 3X8 NADH LF (GAUZE/BANDAGES/DRESSINGS) ×3 IMPLANT
DRSG MEPILEX BORDER 4X4 (GAUZE/BANDAGES/DRESSINGS) ×3 IMPLANT
DRSG MEPILEX BORDER 4X8 (GAUZE/BANDAGES/DRESSINGS) ×3 IMPLANT
DURAPREP 26ML APPLICATOR (WOUND CARE) ×3 IMPLANT
ELECT REM PT RETURN 9FT ADLT (ELECTROSURGICAL) ×3
ELECTRODE REM PT RTRN 9FT ADLT (ELECTROSURGICAL) ×1 IMPLANT
EVACUATOR 1/8 PVC DRAIN (DRAIN) ×3 IMPLANT
GLOVE BIO SURGEON STRL SZ7.5 (GLOVE) ×3 IMPLANT
GLOVE BIO SURGEON STRL SZ8 (GLOVE) ×6 IMPLANT
GLOVE BIOGEL PI IND STRL 8 (GLOVE) ×2 IMPLANT
GLOVE BIOGEL PI INDICATOR 8 (GLOVE) ×4
GOWN STRL REUS W/TWL LRG LVL3 (GOWN DISPOSABLE) ×3 IMPLANT
GOWN STRL REUS W/TWL XL LVL3 (GOWN DISPOSABLE) ×3 IMPLANT
PACK ANTERIOR HIP CUSTOM (KITS) ×3 IMPLANT
STRIP CLOSURE SKIN 1/2X4 (GAUZE/BANDAGES/DRESSINGS) ×2 IMPLANT
SUT ETHIBOND NAB CT1 #1 30IN (SUTURE) ×3 IMPLANT
SUT MNCRL AB 4-0 PS2 18 (SUTURE) ×3 IMPLANT
SUT VIC AB 2-0 CT1 27 (SUTURE) ×4
SUT VIC AB 2-0 CT1 TAPERPNT 27 (SUTURE) ×2 IMPLANT
SUT VLOC 180 0 24IN GS25 (SUTURE) ×3 IMPLANT
SYR 50ML LL SCALE MARK (SYRINGE) IMPLANT
TRAY FOLEY W/METER SILVER 14FR (SET/KITS/TRAYS/PACK) ×3 IMPLANT
TRAY FOLEY W/METER SILVER 16FR (SET/KITS/TRAYS/PACK) ×3 IMPLANT
YANKAUER SUCT BULB TIP 10FT TU (MISCELLANEOUS) ×3 IMPLANT

## 2015-09-21 NOTE — Op Note (Signed)
OPERATIVE REPORT  PREOPERATIVE DIAGNOSIS: Osteoarthritis of the Right hip.   POSTOPERATIVE DIAGNOSIS: Osteoarthritis of the Right  hip.   PROCEDURE: Right total hip arthroplasty, anterior approach.   SURGEON: Ollen Gross, MD   ASSISTANT: Avel Peace, PA-C  ANESTHESIA:  Spinal  ESTIMATED BLOOD LOSS: 250 ml   DRAINS: Hemovac x1.   COMPLICATIONS: None   CONDITION: PACU - hemodynamically stable.   BRIEF CLINICAL NOTE: Michaela Morris is a 69 y.o. female who has advanced end-  stage arthritis of her Right  hip with progressively worsening pain and  dysfunction.The patient has failed nonoperative management and presents for  total hip arthroplasty.   PROCEDURE IN DETAIL: After successful administration of spinal  anesthetic, the traction boots for the [facility] bed were placed on both  feet and the patient was placed onto the [facility] bed, boots placed into the leg  holders. The Right hip was then isolated from the perineum with plastic  drapes and prepped and draped in the usual sterile fashion. ASIS and  greater trochanter were marked and a oblique incision was made, starting  at about 1 cm lateral and 2 cm distal to the ASIS and coursing towards  the anterior cortex of the femur. The skin was cut with a 10 blade  through subcutaneous tissue to the level of the fascia overlying the  tensor fascia lata muscle. The fascia was then incised in line with the  incision at the junction of the anterior third and posterior 2/3rd. The  muscle was teased off the fascia and then the interval between the TFL  and the rectus was developed. The Hohmann retractor was then placed at  the top of the femoral neck over the capsule. The vessels overlying the  capsule were cauterized and the fat on top of the capsule was removed.  A Hohmann retractor was then placed anterior underneath the rectus  femoris to give exposure to the entire anterior capsule. A T-shaped  capsulotomy was performed.  The edges were tagged and the femoral head  was identified.       Osteophytes are removed off the superior acetabulum.  The femoral neck was then cut in situ with an oscillating saw. Traction  was then applied to the left lower extremity utilizing the [facility]  traction. The femoral head was then removed. Retractors were placed  around the acetabulum and then circumferential removal of the labrum was  performed. Osteophytes were also removed. Reaming starts at 43 mm to  medialize and  Increased in 2 mm increments to 47 mm. We reamed in  approximately 40 degrees of abduction, 20 degrees anteversion. A 48 mm  pinnacle acetabular shell was then impacted in anatomic position under  fluoroscopic guidance with excellent purchase. We did not need to place  any additional dome screws. A 28 mm neutral + 4 marathon liner was then  placed into the acetabular shell.       The femoral lift was then placed along the lateral aspect of the femur  just distal to the vastus ridge. The leg was  externally rotated and capsule  was stripped off the inferior aspect of the femoral neck down to the  level of the lesser trochanter, this was done with electrocautery. The femur was lifted after this was performed. The  leg was then placed and extended in adducted position to essentially delivering the femur. We also removed the capsule superiorly and the  piriformis from the piriformis  fossa to gain excellent exposure of the  proximal femur. Rongeur was used to remove some cancellous bone to get  into the lateral portion of the proximal femur for placement of the  initial starter reamer. The starter broaches was placed  the starter broach  and was shown to go down the center of the canal. Broaching  with the  Corail system was then performed starting at size 8, coursing  Up to size 10. A size 10 had excellent torsional and rotational  and axial stability. The trial standard offset neck was then placed  with a 28 + 1.5  trial head. The hip was then reduced. We confirmed that  the stem was in the canal both on AP and lateral x-rays. It also has excellent sizing. The hip was reduced with outstanding stability through full extension, full external rotation,  and then flexion in adduction internal rotation. AP pelvis was taken  and the leg lengths were measured and found to be exactly equal. Hip  was then dislocated again and the femoral head and neck removed. The  femoral broach was removed. Size 10 Corail stem with a standard offset  neck was then impacted into the femur following native anteversion. Has  excellent purchase in the canal. Excellent torsional and rotational and  axial stability. It is confirmed to be in the canal on AP and lateral  fluoroscopic views. The 28 + 1.5 ceramic head was placed and the hip  reduced with outstanding stability. Again AP pelvis was taken and it  confirmed that the leg lengths were equal. The wound was then copiously  irrigated with saline solution and the capsule reattached and repaired  with Ethibond suture. 30 ml of .25% Bupivicaine injected into the capsule and into the edge of the tensor fascia lata as well as subcutaneous tissue. The fascia overlying the tensor fascia lata was  then closed with a running #1 V-Loc. Subcu was closed with interrupted  2-0 Vicryl and subcuticular running 4-0 Monocryl. Incision was cleaned  and dried. Steri-Strips and a bulky sterile dressing applied. Hemovac  drain was hooked to suction and then he was awakened and transported to  recovery in stable condition.        Please note that a surgical assistant was a medical necessity for this procedure to perform it in a safe and expeditious manner. Assistant was necessary to provide appropriate retraction of vital neurovascular structures and to prevent femoral fracture and allow for anatomic placement of the prosthesis.  Ollen GrossFrank Colbi Staubs, M.D.

## 2015-09-21 NOTE — H&P (View-Only) (Signed)
Michaela BankerLinda Morris DOB: 1946/02/08 Married / Language: English / Race: White Female Date of Admission:  09/21/2015 CC:  Right Hip Pain History of Present Illness The patient is a 69 year old female who comes in  for a preoperative History and Physical. The patient is scheduled for a right total hip arthroplasty (anterior) to be performed by Dr. Gus RankinFrank V. Aluisio, MD at [facility] on 09-21-2015. She has already had the left hip replaced. The left hip is doing extremely well at this time. The right hip is what has given her the most trouble. She has had an injection and did not have tremendous amount of benefit with it. She is at a stage now she feels that the right hip needs to be fixed. On the right, she has got severe bone-on-bone arthritis with subchondral cystic formation. She has got advanced arthritic change of that right hip. She wants to go head and get scheduled for hip replacement. They have been treated conservatively in the past for the above stated problem and despite conservative measures, they continue to have progressive pain and severe functional limitations and dysfunction. They have failed non-operative management including home exercise, medications, and injections. It is felt that they would benefit from undergoing total joint replacement. Risks and benefits of the procedure have been discussed with the patient and they elect to proceed with surgery. There are no active contraindications to surgery such as ongoing infection or rapidly progressive neurological disease.  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  Family History  Chronic Obstructive Lung Disease  brother Rheumatoid Arthritis  Maternal Grandfather. Osteoarthritis  mother Bleeding disorder  brother Osteoporosis  mother and grandmother fathers side Heart Disease  mother, father, brother, grandmother mothers side and grandfather mothers side Cancer  mother and father First Degree Relatives  reported Congestive Heart Failure  Maternal Grandmother, Paternal Grandmother. Diabetes Mellitus  Father. Hypertension  mother and father Cerebrovascular Accident  grandmother fathers side Heart disease in female family member before age 69   Social History Current drinker  04/21/2014: Currently drinks wine only occasionally per week Drug/Alcohol Rehab (Previously)  no Tobacco use  04/21/2014 former smoker; smoke(d) less than 1/2 pack(s) per day Living situation  live alone Exercise  Exercises weekly; does other Exercises daily; does other Advance Directives  Living Will, Healthcare POA Tobacco / smoke exposure  04/21/2014: no no Illicit drug use  no Marital status  married Number of flights of stairs before winded  less than 1 1 Alcohol use  current drinker; drinks wine; only occasionally per week Current work status  retired Financial plannerDrug/Alcohol Rehab (Currently)  no No history of drug/alcohol rehab  Not under pain contract  Pain Contract  no Children  3  Medication History Vitamin B-12 ER (Oral) Specific dose unknown - Active. (sublingual and injection) Aspirin (81MG  Tablet, 1 (one) Oral) Active. Vitamin D (1 (one) Oral) Specific dose unknown - Active. Desonide (0.05% Cream,  External) Active. Hydrochlorothiazide (12.5MG Tablet, Oral) Active. Pravastatin Sodium (40MG Tablet, Oral) Active. Synthroid (125MCG Tablet, Oral) Active. Fluticasone Propionate (50MCG/ACT Suspension, Nasal) Active. Levothyroxine Sodium (100MCG Capsule, Oral) Active. Estradiol (1MG Tablet, Oral) Active. Montelukast Sodium (10MG Tablet, Oral) Active. (Singulair) NexIUM (40MG Capsule DR, Oral) Active. celebrex Active.   Past Surgical History  Colon Polyp Removal - Colonoscopy  Breast Biopsy  left Hysterectomy  Date: 1989. complete (non-cancerous) Rotator Cuff Repair  right Arthroscopy of Shoulder  right Dilation and Curettage of Uterus  Total Hip Replacement - Left 12/01/2014   Review of Systems General Not Present- Chills, Fatigue, Fever, Memory Loss, Night Sweats, Weight Gain and Weight Loss. Skin Not Present- Eczema, Hives, Itching, Lesions and Rash. HEENT Not Present- Dentures, Double Vision, Headache, Hearing Loss, Tinnitus and Visual Loss. Respiratory Not Present- Allergies, Chronic Cough, Coughing up blood, Shortness of breath at rest and Shortness of breath with exertion. Cardiovascular Not Present- Chest Pain, Difficulty Breathing Lying Down, Murmur, Palpitations, Racing/skipping heartbeats and Swelling. Gastrointestinal Not Present- Abdominal Pain, Bloody Stool, Constipation, Diarrhea, Difficulty Swallowing, Heartburn, Jaundice, Loss of appetitie, Nausea and Vomiting. Female Genitourinary Present- Urinary frequency. Not Present- Blood in Urine, Discharge, Flank Pain, Incontinence, Painful Urination, Urgency, Urinary Retention, Urinating at Night and Weak urinary stream. Musculoskeletal Present- Morning Stiffness. Not Present- Back Pain, Joint Pain, Joint Swelling, Muscle Pain, Muscle Weakness and Spasms. Neurological Not Present- Blackout spells, Difficulty with balance, Dizziness, Paralysis, Tremor and Weakness. Psychiatric Not Present-  Insomnia.  Vitals Weight: 230 lb Height: 61in Weight was reported by patient. Height was reported by patient. Body Surface Area: 2 m Body Mass Index: 43.46 kg/m  BP: 148/72 (Sitting, Right Arm, Standard)   Physical Exam General Mental Status -Alert, cooperative and good historian. General Appearance-pleasant, Not in acute distress. Orientation-Oriented X3. Build & Nutrition-Well nourished and Well developed.  Head and Neck Head-normocephalic, atraumatic . Neck Global Assessment - supple, no bruit auscultated on the right, no bruit auscultated on the left.  Eye Pupil - Bilateral-Regular and Round. Motion - Bilateral-EOMI.  Chest and Lung Exam Auscultation Breath sounds - clear at anterior chest wall and clear at posterior chest wall. Adventitious sounds - No Adventitious sounds.  Cardiovascular Auscultation Rhythm - Regular rate and rhythm. Heart Sounds - S1 WNL and S2 WNL. Murmurs & Other Heart Sounds - Auscultation of the heart reveals - No Murmurs.  Abdomen Palpation/Percussion Tenderness - Abdomen is non-tender to palpation. Rigidity (guarding) - Abdomen is soft. Auscultation Auscultation of the abdomen reveals - Bowel sounds normal.  Female Genitourinary Note: Not done, not pertinent to present illness   Musculoskeletal Note: She is alert and oriented, no apparent distress. Her left hip can be flexed to 110, rotated in 30, out 40, abductor 40 without discomfort. Her scar looks fine. No evidence of any other openings. Right hip flexion 90. No internal or external rotation about 20 degrees of abduction.  RADIOGRAPHS AP pelvis and lateral of the hips show the prosthesis on the left in excellent position with no periprosthetic abnormalities. On the right, she has got severe bone-on-bone arthritis with subchondral cystic formation.    Assessment & Plan  Primary osteoarthritis of right hip (M16.11) Status post left hip replacement  (Z96.642)  Note:Surgical Plans: Right Total Hip Replacement - Anterior Approach  Disposition: Home with a friend to come over and stay.  PCP: Dr. Moduzuela - Patient has been seen preoperatively and felt to be stable for surgery.  IV TXA  Anesthesia Issues: None  Comments: History of bleeding problems   following a D&C procedure  Signed electronically by Lauraine Rinne, III PA-C

## 2015-09-21 NOTE — Anesthesia Procedure Notes (Signed)
Spinal Patient location during procedure: OR Start time: 09/21/2015 10:24 AM End time: 09/21/2015 10:26 AM Staffing Resident/CRNA: Harle Stanford R Performed by: resident/CRNA  Preanesthetic Checklist Completed: patient identified, site marked, surgical consent, pre-op evaluation, timeout performed, IV checked, risks and benefits discussed and monitors and equipment checked Spinal Block Patient position: sitting Prep: Betadine Patient monitoring: heart rate Location: L3-4 Needle Needle type: Sprotte  Needle gauge: 24 G Needle length: 10 cm Needle insertion depth: 7 cm Assessment Sensory level: T6 Additional Notes Timeout performed. Spinal kit date checked. SAB without difficulty

## 2015-09-21 NOTE — Transfer of Care (Signed)
Immediate Anesthesia Transfer of Care Note  Patient: Michaela BankerLinda Jeziorski  Procedure(s) Performed: Procedure(s): RIGHT TOTAL HIP ARTHROPLASTY ANTERIOR APPROACH (Right)  Patient Location: PACU  Anesthesia Type:Spinal  Level of Consciousness: sedated  Airway & Oxygen Therapy: Patient Spontanous Breathing and Patient connected to face mask oxygen  Post-op Assessment: Report given to RN and Post -op Vital signs reviewed and stable  Post vital signs: Reviewed and stable  Last Vitals:  Filed Vitals:   09/21/15 0849  BP: 106/54  Pulse: 64  Temp: 36.7 C  Resp: 16    Complications: No apparent anesthesia complications

## 2015-09-21 NOTE — Transfer of Care (Deleted)
Immediate Anesthesia Transfer of Care Note  Patient: Michaela Morris  Procedure(s) Performed: Procedure(s): RIGHT TOTAL HIP ARTHROPLASTY ANTERIOR APPROACH (Right)  Patient Location: PACU  Anesthesia Type:Spinal  Level of Consciousness: awake, alert  and oriented  Airway & Oxygen Therapy: Patient Spontanous Breathing and Patient connected to face mask oxygen  Post-op Assessment: Report given to RN and Post -op Vital signs reviewed and stable  Post vital signs: Reviewed and stable  Last Vitals:  Filed Vitals:   09/21/15 0849  BP: 106/54  Pulse: 64  Temp: 36.7 C  Resp: 16    Complications: No apparent anesthesia complications

## 2015-09-21 NOTE — Progress Notes (Signed)
Utilization review completed.  

## 2015-09-21 NOTE — Anesthesia Preprocedure Evaluation (Addendum)
Anesthesia Evaluation  Patient identified by MRN, date of birth, ID band Patient awake    Reviewed: Allergy & Precautions, NPO status , Patient's Chart, lab work & pertinent test results  History of Anesthesia Complications Negative for: history of anesthetic complications  Airway Mallampati: I  TM Distance: >3 FB Neck ROM: Full    Dental  (+) Dental Advisory Given   Pulmonary asthma , sleep apnea and Continuous Positive Airway Pressure Ventilation , COPD,  COPD inhaler, former smoker (quit 1966),    breath sounds clear to auscultation       Cardiovascular (-) anginanegative cardio ROS   Rhythm:Regular Rate:Normal  11/16 Stress: normal perfusion, no ischemia, EF 70%   Neuro/Psych negative neurological ROS     GI/Hepatic Neg liver ROS, GERD  Medicated and Controlled,  Endo/Other  Hypothyroidism Morbid obesity  Renal/GU negative Renal ROS     Musculoskeletal  (+) Arthritis , Osteoarthritis,    Abdominal (+) + obese,   Peds  Hematology 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, pt has never been tested or had symptoms, her brother is diagnosed Hemophilia B    Anesthesia Other Findings   Reproductive/Obstetrics                          Anesthesia Physical Anesthesia Plan  ASA: III  Anesthesia Plan: Spinal   Post-op Pain Management:    Induction:   Airway Management Planned: Natural Airway and Simple Face Mask  Additional Equipment:   Intra-op Plan:   Post-operative Plan:   Informed Consent: I have reviewed the patients History and Physical, chart, labs and discussed the procedure including the risks, benefits and alternatives for the proposed anesthesia with the patient or authorized representative who has indicated his/her understanding and acceptance.   Dental advisory given  Plan Discussed with: Surgeon and CRNA  Anesthesia Plan Comments: (Plan routine monitors,  SAB)        Anesthesia Quick Evaluation

## 2015-09-21 NOTE — Anesthesia Postprocedure Evaluation (Signed)
Anesthesia Post Note  Patient: Michaela Morris  Procedure(s) Performed: Procedure(s) (LRB): RIGHT TOTAL HIP ARTHROPLASTY ANTERIOR APPROACH (Right)  Patient location during evaluation: PACU Anesthesia Type: Spinal Level of consciousness: awake and alert, patient cooperative and oriented Pain management: pain level controlled Vital Signs Assessment: post-procedure vital signs reviewed and stable Respiratory status: spontaneous breathing, nonlabored ventilation and respiratory function stable Cardiovascular status: blood pressure returned to baseline and stable Postop Assessment: no signs of nausea or vomiting, no headache, no backache, spinal receding and patient able to bend at knees Anesthetic complications: no    Last Vitals:  Filed Vitals:   09/21/15 1355 09/21/15 1455  BP: 117/51 123/59  Pulse: 56 66  Temp: 36.6 C 36.5 C  Resp: 13 14    Last Pain:  Filed Vitals:   09/21/15 1505  PainSc: 2                  Sohum Delillo,E. Leyan Branden

## 2015-09-21 NOTE — Interval H&P Note (Signed)
History and Physical Interval Note:  09/21/2015 9:52 AM  Michaela Morris  has presented today for surgery, with the diagnosis of OA RIGHT HIP  The various methods of treatment have been discussed with the patient and family. After consideration of risks, benefits and other options for treatment, the patient has consented to  Procedure(s): RIGHT TOTAL HIP ARTHROPLASTY ANTERIOR APPROACH (Right) as a surgical intervention .  The patient's history has been reviewed, patient examined, no change in status, stable for surgery.  I have reviewed the patient's chart and labs.  Questions were answered to the patient's satisfaction.     Loanne DrillingALUISIO,Fumiko Cham V

## 2015-09-22 LAB — BASIC METABOLIC PANEL
ANION GAP: 7 (ref 5–15)
BUN: 11 mg/dL (ref 6–20)
CHLORIDE: 104 mmol/L (ref 101–111)
CO2: 27 mmol/L (ref 22–32)
Calcium: 8.8 mg/dL — ABNORMAL LOW (ref 8.9–10.3)
Creatinine, Ser: 0.62 mg/dL (ref 0.44–1.00)
GFR calc non Af Amer: >60 mL/min (ref >60)
GLUCOSE: 174 mg/dL — AB (ref 65–99)
POTASSIUM: 3.9 mmol/L (ref 3.5–5.1)
Sodium: 138 mmol/L (ref 135–145)

## 2015-09-22 LAB — CBC
HEMATOCRIT: 37.8 % (ref 36.0–46.0)
HEMOGLOBIN: 11.8 g/dL — AB (ref 12.0–15.0)
MCH: 26.5 pg (ref 26.0–34.0)
MCHC: 31.2 g/dL (ref 30.0–36.0)
MCV: 84.9 fL (ref 78.0–100.0)
Platelets: 245 10*3/uL (ref 150–400)
RBC: 4.45 MIL/uL (ref 3.87–5.11)
RDW: 16.2 % — ABNORMAL HIGH (ref 11.5–15.5)
WBC: 17.7 10*3/uL — AB (ref 4.0–10.5)

## 2015-09-22 MED ORDER — METHOCARBAMOL 500 MG PO TABS: 500.0000 mg | ORAL_TABLET | Freq: Four times a day (QID) | ORAL | Status: DC | PRN

## 2015-09-22 MED ORDER — NON FORMULARY: 40.0000 mg | Freq: Every day | Status: DC

## 2015-09-22 MED ORDER — ESOMEPRAZOLE MAGNESIUM 40 MG PO CPDR
40.0000 mg | DELAYED_RELEASE_CAPSULE | Freq: Every day | ORAL | Status: DC
  Administered 2015-09-22 – 2015-09-23 (×2): 40 mg via ORAL
  Filled 2015-09-22 (×4): qty 1

## 2015-09-22 MED ORDER — RIVAROXABAN 10 MG PO TABS: 10.0000 mg | ORAL_TABLET | Freq: Every day | ORAL | Status: DC

## 2015-09-22 MED ORDER — OXYCODONE HCL 5 MG PO TABS
5.0000 mg | ORAL_TABLET | ORAL | Status: DC | PRN
Start: 2015-09-22 — End: 2022-11-19

## 2015-09-22 MED ORDER — TRAMADOL HCL 50 MG PO TABS
50.0000 mg | ORAL_TABLET | Freq: Four times a day (QID) | ORAL | Status: DC | PRN
Start: 2015-09-22 — End: 2022-11-19

## 2015-09-22 NOTE — Discharge Instructions (Addendum)
° °Dr. Frank Aluisio °Total Joint Specialist °[facility] °[address] °[place], [place] [address] °[phone] ° °ANTERIOR APPROACH TOTAL HIP REPLACEMENT POSTOPERATIVE DIRECTIONS ° ° °Hip Rehabilitation, Guidelines Following Surgery  °The results of a hip operation are greatly improved after range of motion and muscle strengthening exercises. Follow all safety measures which are given to protect your hip. If any of these exercises cause increased pain or swelling in your joint, decrease the amount until you are comfortable again. Then slowly increase the exercises. Call your caregiver if you have problems or questions.  ° °HOME CARE INSTRUCTIONS  °Remove items at home which could result in a fall. This includes throw rugs or furniture in walking pathways.  °· ICE to the affected hip every three hours for 30 minutes at a time and then as needed for pain and swelling.  Continue to use ice on the hip for pain and swelling from surgery. You may notice swelling that will progress down to the foot and ankle.  This is normal after surgery.  Elevate the leg when you are not up walking on it.   °· Continue to use the breathing machine which will help keep your temperature down.  It is common for your temperature to cycle up and down following surgery, especially at night when you are not up moving around and exerting yourself.  The breathing machine keeps your lungs expanded and your temperature down. ° ° °DIET °You may resume your previous home diet once your are discharged from the hospital. ° °DRESSING / WOUND CARE / SHOWERING °You may shower 3 days after surgery, but keep the wounds dry during showering.  You may use an occlusive plastic wrap (Press'n Seal for example), NO SOAKING/SUBMERGING IN THE BATHTUB.  If the bandage gets wet, change with a clean dry gauze.  If the incision gets wet, pat the wound dry with a clean towel. °You may start showering once you are discharged home but do not  submerge the incision under water. Just pat the incision dry and apply a dry gauze dressing on daily. °Change the surgical dressing daily and reapply a dry dressing each time. ° °ACTIVITY °Walk with your walker as instructed. °Use walker as long as suggested by your caregivers. °Avoid periods of inactivity such as sitting longer than an hour when not asleep. This helps prevent blood clots.  °You may resume a sexual relationship in one month or when given the OK by your doctor.  °You may return to work once you are cleared by your doctor.  °Do not drive a car for 6 weeks or until released by you surgeon.  °Do not drive while taking narcotics. ° °WEIGHT BEARING °Weight bearing as tolerated with assist device (walker, cane, etc) as directed, use it as long as suggested by your surgeon or therapist, typically at least 4-6 weeks. ° °POSTOPERATIVE CONSTIPATION PROTOCOL °Constipation - defined medically as fewer than three stools per week and severe constipation as less than one stool per week. ° °One of the most common issues patients have following surgery is constipation.  Even if you have a regular bowel pattern at home, your normal regimen is likely to be disrupted due to multiple reasons following surgery.  Combination of anesthesia, postoperative narcotics, change in appetite and fluid intake all can affect your bowels.  In order to avoid complications following surgery, here are some recommendations in order to help you during your recovery period. ° °Colace (docusate) - Pick up an over-the-counter   form of Colace or another stool softener and take twice a day as long as you are requiring postoperative pain medications.  Take with a full glass of water daily.  If you experience loose stools or diarrhea, hold the colace until you stool forms back up.  If your symptoms do not get better within 1 week or if they get worse, check with your doctor. ° °Dulcolax (bisacodyl) - Pick up over-the-counter and take as directed  by the product packaging as needed to assist with the movement of your bowels.  Take with a full glass of water.  Use this product as needed if not relieved by Colace only.  ° °MiraLax (polyethylene glycol) - Pick up over-the-counter to have on hand.  MiraLax is a solution that will increase the amount of water in your bowels to assist with bowel movements.  Take as directed and can mix with a glass of water, juice, soda, coffee, or tea.  Take if you go more than two days without a movement. °Do not use MiraLax more than once per day. Call your doctor if you are still constipated or irregular after using this medication for 7 days in a row. ° °If you continue to have problems with postoperative constipation, please contact the office for further assistance and recommendations.  If you experience "the worst abdominal pain ever" or develop nausea or vomiting, please contact the office immediatly for further recommendations for treatment. ° °ITCHING ° If you experience itching with your medications, try taking only a single pain pill, or even half a pain pill at a time.  You can also use Benadryl over the counter for itching or also to help with sleep.  ° °TED HOSE STOCKINGS °Wear the elastic stockings on both legs for three weeks following surgery during the day but you may remove then at night for sleeping. ° °MEDICATIONS °See your medication summary on the “After Visit Summary” that the nursing staff will review with you prior to discharge.  You may have some home medications which will be placed on hold until you complete the course of blood thinner medication.  It is important for you to complete the blood thinner medication as prescribed by your surgeon.  Continue your approved medications as instructed at time of discharge. ° °PRECAUTIONS °If you experience chest pain or shortness of breath - call 911 immediately for transfer to the hospital emergency department.  °If you develop a fever greater that 101 F,  purulent drainage from wound, increased redness or drainage from wound, foul odor from the wound/dressing, or calf pain - CONTACT YOUR SURGEON.   °                                                °FOLLOW-UP APPOINTMENTS °Make sure you keep all of your appointments after your operation with your surgeon and caregivers. You should call the office at the above phone number and make an appointment for approximately two weeks after the date of your surgery or on the date instructed by your surgeon outlined in the "After Visit Summary". ° °RANGE OF MOTION AND STRENGTHENING EXERCISES  °These exercises are designed to help you keep full movement of your hip joint. Follow your caregiver's or physical therapist's instructions. Perform all exercises about fifteen times, three times per day or as directed. Exercise both hips, even if you   have had only one joint replacement. These exercises can be done on a training (exercise) mat, on the floor, on a table or on a bed. Use whatever works the best and is most comfortable for you. Use music or television while you are exercising so that the exercises are a pleasant break in your day. This will make your life better with the exercises acting as a break in routine you can look forward to.  Lying on your back, slowly slide your foot toward your buttocks, raising your knee up off the floor. Then slowly slide your foot back down until your leg is straight again.  Lying on your back spread your legs as far apart as you can without causing discomfort.  Lying on your side, raise your upper leg and foot straight up from the floor as far as is comfortable. Slowly lower the leg and repeat.  Lying on your back, tighten up the muscle in the front of your thigh (quadriceps muscles). You can do this by keeping your leg straight and trying to raise your heel off the floor. This helps strengthen the largest muscle supporting your knee.  Lying on your back, tighten up the muscles of your  buttocks both with the legs straight and with the knee bent at a comfortable angle while keeping your heel on the floor.   IF YOU ARE TRANSFERRED TO A SKILLED REHAB FACILITY If the patient is transferred to a skilled rehab facility following release from the hospital, a list of the current medications will be sent to the facility for the patient to continue.  When discharged from the skilled rehab facility, please have the facility set up the patient's Home Health Physical Therapy prior to being released. Also, the skilled facility will be responsible for providing the patient with their medications at time of release from the facility to include their pain medication, the muscle relaxants, and their blood thinner medication. If the patient is still at the rehab facility at time of the two week follow up appointment, the skilled rehab facility will also need to assist the patient in arranging follow up appointment in our office and any transportation needs.  MAKE SURE YOU:  Understand these instructions.  Get help right away if you are not doing well or get worse.    Pick up stool softner and laxative for home use following surgery while on pain medications. Do not submerge incision under water. Please use good hand washing techniques while changing dressing each day. May shower starting three days after surgery. Please use a clean towel to pat the incision dry following showers. Continue to use ice for pain and swelling after surgery. Do not use any lotions or creams on the incision until instructed by your surgeon.  Take Xarelto for two and a half more weeks, then discontinue Xarelto. Once the patient has completed the Xarelto, they may resume the 81 mg Aspirin.    Information on my medicine - XARELTO (Rivaroxaban)  This medication education was reviewed with me or my healthcare representative as part of my discharge preparation.  The pharmacist that spoke with me during my hospital stay  was:  WOFFORD, DREW A, RPH  Why was Xarelto prescribed for you? Xarelto was prescribed for you to reduce the risk of blood clots forming after orthopedic surgery. The medical term for these abnormal blood clots is venous thromboembolism (VTE).  What do you need to know about xarelto ? Take your Xarelto ONCE DAILY at the same time  every day. You may take it either with or without food.  If you have difficulty swallowing the tablet whole, you may crush it and mix in applesauce just prior to taking your dose.  Take Xarelto exactly as prescribed by your doctor and DO NOT stop taking Xarelto without talking to the doctor who prescribed the medication.  Stopping without other VTE prevention medication to take the place of Xarelto may increase your risk of developing a clot.  After discharge, you should have regular check-up appointments with your healthcare provider that is prescribing your Xarelto.    What do you do if you miss a dose? If you miss a dose, take it as soon as you remember on the same day then continue your regularly scheduled once daily regimen the next day. Do not take two doses of Xarelto on the same day.   Important Safety Information A possible side effect of Xarelto is bleeding. You should call your healthcare provider right away if you experience any of the following: ? Bleeding from an injury or your nose that does not stop. ? Unusual colored urine (red or dark brown) or unusual colored stools (red or black). ? Unusual bruising for unknown reasons. ? A serious fall or if you hit your head (even if there is no bleeding).  Some medicines may interact with Xarelto and might increase your risk of bleeding while on Xarelto. To help avoid this, consult your healthcare provider or pharmacist prior to using any new prescription or non-prescription medications, including herbals, vitamins, non-steroidal anti-inflammatory drugs (NSAIDs) and supplements.  This website has  more information on Xarelto: VisitDestination.com.br.

## 2015-09-22 NOTE — Progress Notes (Signed)
OT Cancellation Note  Patient Details Name: Michaela Morris MRN: [id number] DOB: 31-Jul-1946   Cancelled Treatment:    Reason Eval/Treat Not Completed: PT screened, no needs identified, will sign off.  Pt had other hip replaced in February of this year.  [name] 09/22/2015, 9:59 AM  Marica OtterMaryellen Kaliope Quinonez, OTR/L [phone] 09/22/2015

## 2015-09-22 NOTE — Progress Notes (Signed)
Pt has home CPAP and prefers self placement.  Pt to notify RT if any assistance is needed.  RT to monitor and assess as needed.  

## 2015-09-22 NOTE — Progress Notes (Signed)
   Subjective: 1 Day Post-Op Procedure(s) (LRB): RIGHT TOTAL HIP ARTHROPLASTY ANTERIOR APPROACH (Right) Patient reports pain as mild.   Patient seen in rounds by [name]. Patient is well, but has had some minor complaints of pain in the hip, requiring pain medications We will start therapy today.  Plan is to go Home after hospital stay.  Objective: Vital signs in last 24 hours: Temp:  [97.4 F (36.3 C)-98.6 F (37 C)] 97.9 F (36.6 C) (12/08 0547) Pulse Rate:  [56-82] 78 (12/08 0547) Resp:  [12-16] 16 (12/08 0547) BP: (106-128)/(44-64) 128/58 mmHg (12/08 0547) SpO2:  [94 %-100 %] 97 % (12/08 0547) Weight:  [105.235 kg (232 lb)] 105.235 kg (232 lb) (12/07 1355)  Intake/Output from previous day:  Intake/Output Summary (Last 24 hours) at 09/22/15 0723 Last data filed at 09/22/15 [id number]  Gross per 24 hour  Intake   4370 ml  Output   3550 ml  Net    820 ml    Intake/Output this shift: UOP 700 since around [place]  Labs:  Recent Labs  09/22/15 0512  HGB 11.8*    Recent Labs  09/22/15 0512  WBC 17.7*  RBC 4.45  HCT 37.8  PLT 245    Recent Labs  09/22/15 0512  NA 138  K 3.9  CL 104  CO2 27  BUN 11  CREATININE 0.62  GLUCOSE 174*  CALCIUM 8.8*   No results for input(s): LABPT, INR in the last 72 hours.  EXAM General - Patient is Alert, Appropriate and Oriented Extremity - Neurovascular intact Sensation intact distally Dorsiflexion/Plantar flexion intact Dressing - dressing C/D/I Motor Function - intact, moving foot and toes well on exam.  Hemovac pulled without difficulty.  Past Medical History  Diagnosis Date  . Spondylolisthesis     hx of   . Degenerative lumbar disc   . Bursitis of left hip   . Sleep apnea     uses CPAP  . Shortness of breath dyspnea     walking distances or climbing stairs  . Bronchitis     hx of   . Hypothyroidism   . GERD (gastroesophageal reflux disease)   . Hypercholesteremia   . Asthma   . Osteoarthritis     left  knee/left hip   . 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   Weight as of this encounter: 105.235 kg (232 lb). Advance diet Up with therapy Plan for discharge tomorrow Discharge home with home health  DVT Prophylaxis - Xarelto Weight Bearing As Tolerated right Leg Hemovac Pulled Begin Therapy  Michaela Peacerew Perkins, PA-C Orthopaedic Surgery 09/22/2015, 7:23 AM

## 2015-09-22 NOTE — Evaluation (Signed)
Physical Therapy Evaluation Patient Details Name: Michaela Morris MRN: [id number] DOB: 05-23-1946 Today's Date: 09/22/2015   History of Present Illness  RIGHT TOTAL HIP ARTHROPLASTY ANTERIOR APPROACH (Right  Clinical Impression  Patient is progressing well. Ambulated x 400+ feet . Patient will benefit from PT to address problems listed in the note  Below to DC to home.    Follow Up Recommendations Home health PT;Supervision/Assistance - 24 hour    Equipment Recommendations  None recommended by PT    Recommendations for Other Services       Precautions / Restrictions Precautions Precautions: Fall      Mobility  Bed Mobility Overal bed mobility: Needs Assistance Bed Mobility: Sit to Supine       Sit to supine: Min assist   General bed mobility comments: for R leg onto the bed  Transfers Overall transfer level: Needs assistance Equipment used: Rolling walker (2 wheeled) Transfers: Sit to/from Stand Sit to Stand: Supervision         General transfer comment[facility],  Ambulation/Gait Ambulation/Gait assistance: Min guard Ambulation Distance (Feet): 400 Feet Assistive device: Rolling walker (2 wheeled) Gait Pattern/deviations: Step-through pattern   Gait velocity interpretation: at or above normal speed for age/gender General Gait Details: swing on R leg is good.  Stairs            Wheelchair Mobility    Modified Rankin (Stroke Patients Only)       Balance                                             Pertinent Vitals/Pain Pain Assessment: 0-10 Pain Score: 3  Pain Location: R hip Pain Descriptors / Indicators: Discomfort;Sore Pain Intervention(s): Premedicated before session;Repositioned;Ice applied    Home Living Family/patient expects to be discharged to:: Private residence Living Arrangements: Alone Available Help at Discharge: Family;Friend(s)   Home Access: Stairs to enter Entrance Stairs-Rails:  None Entrance Stairs-Number of Steps: 1 Home Layout: One level Home Equipment: Walker - 2 wheels;Cane - quad      Prior Function Level of Independence: Independent;Independent with assistive device(s)               Hand Dominance        Extremity/Trunk Assessment   Upper Extremity Assessment: Overall WFL for tasks assessed           Lower Extremity Assessment: RLE deficits/detail RLE Deficits / Details: advances the leg without a problem    Cervical / Trunk Assessment: Normal  Communication   Communication: No difficulties  Cognition Arousal/Alertness: Awake/alert Behavior During Therapy: WFL for tasks assessed/performed Overall Cognitive Status: Within Functional Limits for tasks assessed                      General Comments      Exercises Total Joint Exercises Ankle Circles/Pumps: AROM;Both;10 reps;Supine Short Arc Quad: AROM;Right;10 reps;Supine Heel Slides: AROM;Right;10 reps;Supine Hip ABduction/ADduction: AROM;Right;10 reps;Supine Long Arc Quad: AROM;Right;10 reps;Supine      Assessment/Plan    PT Assessment Patient needs continued PT services  PT Diagnosis Difficulty walking   PT Problem List Decreased strength;Decreased range of motion;Decreased activity tolerance;Decreased mobility;Decreased knowledge of use of DME;Decreased safety awareness;Decreased knowledge of precautions;Pain  PT Treatment Interventions DME instruction;Gait training;Stair training;Functional mobility training;Therapeutic activities;Therapeutic exercise;Patient/family education   PT Goals (Current goals can be found in the  Care Plan section) Acute Rehab PT Goals Patient Stated Goal: to go home, walk better. PT Goal Formulation: With patient Time For Goal Achievement: 09/24/15 Potential to Achieve Goals: Good    Frequency 7X/week   Barriers to discharge        Co-evaluation               End of Session   Activity Tolerance: Patient tolerated  treatment well Patient left: in bed;with call bell/phone within reach;with bed alarm set Nurse Communication: Mobility status         Time: 4098-1191 PT Time Calculation (min) (ACUTE ONLY): 26 min   Charges:   PT Evaluation $Initial PT Evaluation Tier I: 1 Procedure PT Treatments $Gait Training: 8-22 mins   PT G Codes:        Rada Hay 09/22/2015, 10:25 AM Blanchard Kelch PT [phone]

## 2015-09-22 NOTE — Care Management Note (Signed)
Case Management Note  Patient Details  Name: Michaela Morris MRN: [id number] Date of Birth: 02/07/1946  Subjective/Objective:        S/p Right total hip arthroplasty            Action/Plan: Cm met with pt in room to offer choice of home health agency. Pt chooses [facility] in [place]. CM called [facility] at [phone] who requested I fax facesheet, orders, F2F, H&P, OP Note, and PT/OT EVALs. Cm faxed requested information to [phone]. No DME required, has from previous surgeries.   Expected Discharge Date:                  Expected Discharge Plan:  [place]  In-House Referral:  NA  Discharge planning Services  CM Consult  Post Acute Care Choice:  Home Health Choice offered to:  Patient  DME Arranged:  N/A DME Agency:  NA  HH Arranged:  PT [place] Agency:  Other - See comment  Status of Service:  Completed, signed off  Medicare Important Message Given:    Date Medicare IM Given:    Medicare IM give by:    Date Additional Medicare IM Given:    Additional Medicare Important Message give by:     If discussed at [place] of Stay Meetings, dates discussed:    Additional Comments:  Guadalupe Maple, RN 09/22/2015, 11:55 AM

## 2015-09-22 NOTE — Discharge Summary (Signed)
Physician Discharge Summary   Patient ID: Michaela Morris MRN: [id number] DOB/AGE: [birth year] 69 y.o.  Admit date: 09/21/2015 Discharge date:  09/23/2015 Primary Diagnosis:  Osteoarthritis of the Right hip.  Admission Diagnoses:  Past Medical History  Diagnosis Date  . Spondylolisthesis     hx of   . Degenerative lumbar disc   . Bursitis of left hip   . Sleep apnea     uses CPAP  . Shortness of breath dyspnea     walking distances or climbing stairs  . Bronchitis     hx of   . Hypothyroidism   . GERD (gastroesophageal reflux disease)   . Hypercholesteremia   . Asthma   . Osteoarthritis     left knee/left hip   . 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   Weight as of this encounter: 105.235 kg (232 lb).  Procedure(s) (LRB): RIGHT TOTAL HIP ARTHROPLASTY ANTERIOR APPROACH (Right)   Consults: None  HPI: Michaela Morris is a 69 y.o. female who has advanced end-  stage arthritis of her Right hip with progressively worsening pain and  dysfunction.The patient has failed nonoperative management and presents for  total hip arthroplasty.  Laboratory Data: Admission on 09/21/2015  Component Date Value Ref Range Status  . WBC 09/22/2015 17.7* 4.0 - 10.5 K/uL Final  . RBC 09/22/2015 4.45  3.87 - 5.11 MIL/uL Final  . Hemoglobin 09/22/2015 11.8* 12.0 - 15.0 g/dL Final  . HCT 09/22/2015 37.8  36.0 - 46.0 % Final  . MCV 09/22/2015 84.9  78.0 - 100.0 fL Final  . MCH 09/22/2015 26.5  26.0 - 34.0 pg Final  . MCHC 09/22/2015 31.2  30.0 - 36.0 g/dL Final  . RDW 09/22/2015 16.2* 11.5 - 15.5 % Final  . Platelets 09/22/2015 245  150 - 400 K/uL Final  . Sodium 09/22/2015 138  135 - 145 mmol/L Final  . Potassium 09/22/2015 3.9  3.5 - 5.1 mmol/L Final  . Chloride  09/22/2015 104  101 - 111 mmol/L Final  . CO2 09/22/2015 27  22 - 32 mmol/L Final  . Glucose, Bld 09/22/2015 174* 65 - 99 mg/dL Final  . BUN 09/22/2015 11  6 - 20 mg/dL Final  . Creatinine, Ser 09/22/2015 0.62  0.44 - 1.00 mg/dL Final  . Calcium 09/22/2015 8.8* 8.9 - 10.3 mg/dL Final  . GFR calc non Af Amer 09/22/2015 >60  >60 mL/min Final  . GFR calc Af Amer 09/22/2015 >60  >60 mL/min Final   Comment: (NOTE) The eGFR has been calculated using the CKD EPI equation. This calculation has not been validated in all clinical situations. eGFR's persistently <60 mL/min signify possible Chronic Kidney Disease.   Georgiann Hahn gap 09/22/2015 7  5 - 15 Final  Hospital Outpatient Visit on 09/14/2015  Component Date Value Ref Range Status  . Coagulation Factor IX 09/14/2015 70  60 - 177 % Final   Comment: (NOTE) Performed At: [facility] [place], [place] [id number] Lindon Romp MD XB:[id number]   . MRSA, PCR 09/14/2015 NEGATIVE  NEGATIVE Final  . Staphylococcus aureus 09/14/2015 NEGATIVE  NEGATIVE Final   Comment:        The Xpert SA Assay (FDA approved for NASAL specimens in patients over  83 years of age), is one component of a comprehensive surveillance program.  Test performance has been validated by [facility] for patients greater than or equal to 71 year old. It is not intended to diagnose infection nor to guide or monitor treatment.   Marland Kitchen aPTT 09/14/2015 32  24 - 37 seconds Final  . WBC 09/14/2015 9.0  4.0 - 10.5 K/uL Final  . RBC 09/14/2015 4.81  3.87 - 5.11 MIL/uL Final  . Hemoglobin 09/14/2015 12.8  12.0 - 15.0 g/dL Final  . HCT 09/14/2015 40.1  36.0 - 46.0 % Final  . MCV 09/14/2015 83.4  78.0 - 100.0 fL Final  . MCH 09/14/2015 26.6  26.0 - 34.0 pg Final  . MCHC 09/14/2015 31.9  30.0 - 36.0 g/dL Final  . RDW 09/14/2015 16.1* 11.5 - 15.5 % Final  . Platelets 09/14/2015 266  150 - 400 K/uL Final  . Sodium 09/14/2015 137  135 - 145 mmol/L Final  .  Potassium 09/14/2015 3.8  3.5 - 5.1 mmol/L Final  . Chloride 09/14/2015 105  101 - 111 mmol/L Final  . CO2 09/14/2015 26  22 - 32 mmol/L Final  . Glucose, Bld 09/14/2015 155* 65 - 99 mg/dL Final  . BUN 09/14/2015 12  6 - 20 mg/dL Final  . Creatinine, Ser 09/14/2015 0.64  0.44 - 1.00 mg/dL Final  . Calcium 09/14/2015 8.8* 8.9 - 10.3 mg/dL Final  . Total Protein 09/14/2015 7.2  6.5 - 8.1 g/dL Final  . Albumin 09/14/2015 3.7  3.5 - 5.0 g/dL Final  . AST 09/14/2015 24  15 - 41 U/L Final  . ALT 09/14/2015 20  14 - 54 U/L Final  . Alkaline Phosphatase 09/14/2015 84  38 - 126 U/L Final  . Total Bilirubin 09/14/2015 0.3  0.3 - 1.2 mg/dL Final  . GFR calc non Af Amer 09/14/2015 >60  >60 mL/min Final  . GFR calc Af Amer 09/14/2015 >60  >60 mL/min Final   Comment: (NOTE) The eGFR has been calculated using the CKD EPI equation. This calculation has not been validated in all clinical situations. eGFR's persistently <60 mL/min signify possible Chronic Kidney Disease.   . Anion gap 09/14/2015 6  5 - 15 Final  . Prothrombin Time 09/14/2015 12.7  11.6 - 15.2 seconds Final  . INR 09/14/2015 0.93  0.00 - 1.49 Final  . ABO/RH(D) 09/14/2015 O POS   Final  . Antibody Screen 09/14/2015 NEG   Final  . Sample Expiration 09/14/2015 09/24/2015   Final  . Extend sample reason 09/14/2015 NO TRANSFUSIONS OR PREGNANCY IN THE PAST 3 MONTHS   Final  . Color, Urine 09/14/2015 YELLOW  YELLOW Final  . APPearance 09/14/2015 CLEAR  CLEAR Final  . Specific Gravity, Urine 09/14/2015 1.012  1.005 - 1.030 Final  . pH 09/14/2015 6.0  5.0 - 8.0 Final  . Glucose, UA 09/14/2015 NEGATIVE  NEGATIVE mg/dL Final  . Hgb urine dipstick 09/14/2015 NEGATIVE  NEGATIVE Final  . Bilirubin Urine 09/14/2015 NEGATIVE  NEGATIVE Final  . Ketones, ur 09/14/2015 NEGATIVE  NEGATIVE mg/dL Final  . Protein, ur 09/14/2015 NEGATIVE  NEGATIVE mg/dL Final  . Nitrite 09/14/2015 NEGATIVE  NEGATIVE Final  . Leukocytes, UA 09/14/2015 NEGATIVE  NEGATIVE  Final   MICROSCOPIC NOT DONE ON URINES WITH NEGATIVE PROTEIN, BLOOD, LEUKOCYTES, NITRITE, OR GLUCOSE <1000 mg/dL.     X-Rays:Dg Pelvis Portable  09/21/2015  CLINICAL DATA:  Status post RIGHT THR. EXAM: DG C-ARM 1-60 MIN-NO REPORT; PORTABLE PELVIS 1-2 VIEWS COMPARISON:  None. FINDINGS: There is no evidence of pelvic fracture or diastasis. No pelvic bone lesions are seen. Status post RIGHT THR earlier today and previous LEFT THR. Satisfactory position and alignment. IMPRESSION: No adverse features. Electronically Signed   By: Staci Righter M.D.   On: 09/21/2015 13:04   Dg C-arm 1-60 Min-no Report  09/21/2015  CLINICAL DATA: surgery C-ARM 1-60 MINUTES Fluoroscopy was utilized by the requesting physician.  No radiographic interpretation.    EKG: Orders placed or performed during the hospital encounter of 09/14/15  . EKG 12 lead  . EKG 12 lead     Hospital Course: Patient was admitted to [facility] and taken to the OR and underwent the above state procedure without complications.  Patient tolerated the procedure well and was later transferred to the recovery room and then to the orthopaedic floor for postoperative care.  They were given PO and IV analgesics for pain control following their surgery.  They were given 24 hours of postoperative antibiotics of  Anti-infectives    Start     Dose/Rate Route Frequency Ordered Stop   09/21/15 1600  ceFAZolin (ANCEF) IVPB 2 g/50 mL premix     2 g 100 mL/hr over 30 Minutes Intravenous Every 6 hours 09/21/15 1406 09/21/15 2158   09/21/15 0957  ceFAZolin (ANCEF) IVPB 2 g/50 mL premix     2 g 100 mL/hr over 30 Minutes Intravenous On call to O.R. 09/21/15 0957 09/21/15 1021     and started on DVT prophylaxis in the form of Xarelto.   PT and OT were ordered for total hip protocol.  The patient was allowed to be WBAT with therapy. Discharge planning was consulted to help with postop disposition and equipment needs.  Patient had a decent night on the  evening of surgery.  They started to get up OOB with therapy on day one.  Hemovac drain was pulled without difficulty.  Continued to work with therapy into day two.  Dressing was changed on day two and the incision was healing well.  Patient was seen in rounds and was ready to go home.  Discharge home with home health Diet - Cardiac diet Follow up - in 2 weeks Activity - WBAT Disposition - Home Condition Upon Discharge - Good D/C Meds - See DC Summary DVT Prophylaxis - Xarelto  Discharge Instructions    Call MD / Call 911    Complete by:  As directed   If you experience chest pain or shortness of breath, CALL 911 and be transported to the hospital emergency room.  If you develope a fever above 101 F, pus (white drainage) or increased drainage or redness at the wound, or calf pain, call your surgeon's office.     Change dressing    Complete by:  As directed   You may change your dressing dressing daily with sterile 4 x 4 inch gauze dressing and paper tape.  Do not submerge the incision under water.     Constipation Prevention    Complete by:  As directed   Drink plenty of fluids.  Prune juice may be helpful.  You may use a stool softener, such as Colace (over the counter) 100 mg twice a day.  Use MiraLax (over the counter) for constipation as needed.     Diet general    Complete by:  As directed      Discharge instructions    Complete by:  As directed   Pick up stool softner and laxative  for home use following surgery while on pain medications. Do not submerge incision under water. Please use good hand washing techniques while changing dressing each day. May shower starting three days after surgery. Please use a clean towel to pat the incision dry following showers. Continue to use ice for pain and swelling after surgery. Do not use any lotions or creams on the incision until instructed by your surgeon.  Total Hip Protocol.  Take Xarelto for two and a half more weeks, then  discontinue Xarelto. Once the patient has completed the Xarelto, they may resume the 81 mg Aspirin.  Postoperative Constipation Protocol  Constipation - defined medically as fewer than three stools per week and severe constipation as less than one stool per week.  One of the most common issues patients have following surgery is constipation.  Even if you have a regular bowel pattern at home, your normal regimen is likely to be disrupted due to multiple reasons following surgery.  Combination of anesthesia, postoperative narcotics, change in appetite and fluid intake all can affect your bowels.  In order to avoid complications following surgery, here are some recommendations in order to help you during your recovery period.  Colace (docusate) - Pick up an over-the-counter form of Colace or another stool softener and take twice a day as long as you are requiring postoperative pain medications.  Take with a full glass of water daily.  If you experience loose stools or diarrhea, hold the colace until you stool forms back up.  If your symptoms do not get better within 1 week or if they get worse, check with your doctor.  Dulcolax (bisacodyl) - Pick up over-the-counter and take as directed by the product packaging as needed to assist with the movement of your bowels.  Take with a full glass of water.  Use this product as needed if not relieved by Colace only.   MiraLax (polyethylene glycol) - Pick up over-the-counter to have on hand.  MiraLax is a solution that will increase the amount of water in your bowels to assist with bowel movements.  Take as directed and can mix with a glass of water, juice, soda, coffee, or tea.  Take if you go more than two days without a movement. Do not use MiraLax more than once per day. Call your doctor if you are still constipated or irregular after using this medication for 7 days in a row.  If you continue to have problems with postoperative constipation, please contact  the office for further assistance and recommendations.  If you experience "the worst abdominal pain ever" or develop nausea or vomiting, please contact the office immediatly for further recommendations for treatment.     Do not sit on low chairs, stoools or toilet seats, as it may be difficult to get up from low surfaces    Complete by:  As directed      Driving restrictions    Complete by:  As directed   No driving until released by the physician.     Increase activity slowly as tolerated    Complete by:  As directed      Lifting restrictions    Complete by:  As directed   No lifting until released by the physician.     Patient may shower    Complete by:  As directed   You may shower without a dressing once there is no drainage.  Do not wash over the wound.  If drainage remains, do not shower until  drainage stops.     TED hose    Complete by:  As directed   Use stockings (TED hose) for 3 weeks on both leg(s).  You may remove them at night for sleeping.     Weight bearing as tolerated    Complete by:  As directed   Laterality:  right  Extremity:  Lower            Medication List    STOP taking these medications        aspirin EC 81 MG tablet     celecoxib 200 MG capsule  Commonly known as:  CELEBREX     Cholecalciferol 2000 UNITS Caps     CoQ-10 200 MG Caps     estradiol 1 MG tablet  Commonly known as:  ESTRACE     Vitamin B-12 1000 MCG Subl      TAKE these medications        acetaminophen 500 MG tablet  Commonly known as:  TYLENOL  Take 500 mg by mouth every 6 (six) hours as needed for mild pain or moderate pain.     desonide 0.05 % cream  Commonly known as:  DESOWEN  Apply 1 application topically daily as needed (for rash).     docusate sodium 100 MG capsule  Commonly known as:  COLACE  Take 100 mg by mouth 2 (two) times daily.     esomeprazole 40 MG capsule  Commonly known as:  NEXIUM  Take 40 mg by mouth daily before breakfast.     fluticasone 50  MCG/ACT nasal spray  Commonly known as:  FLONASE  Place 2 sprays into both nostrils daily as needed for allergies.     levothyroxine 125 MCG tablet  Commonly known as:  SYNTHROID, LEVOTHROID  Take 125 mcg by mouth daily before breakfast.     methocarbamol 500 MG tablet  Commonly known as:  ROBAXIN  Take 1 tablet (500 mg total) by mouth every 6 (six) hours as needed for muscle spasms.     montelukast 10 MG tablet  Commonly known as:  SINGULAIR  Take 10 mg by mouth at bedtime.     oxyCODONE 5 MG immediate release tablet  Commonly known as:  Oxy IR/ROXICODONE  Take 1-2 tablets (5-10 mg total) by mouth every 3 (three) hours as needed for moderate pain or severe pain.     pravastatin 40 MG tablet  Commonly known as:  PRAVACHOL  Take 40 mg by mouth at bedtime.     rivaroxaban 10 MG Tabs tablet  Commonly known as:  XARELTO  Take 1 tablet (10 mg total) by mouth daily with breakfast. Take Xarelto for two and a half more weeks, then discontinue Xarelto. Once the patient has completed the Xarelto, they may resume the 81 mg Aspirin.     traMADol 50 MG tablet  Commonly known as:  ULTRAM  Take 1-2 tablets (50-100 mg total) by mouth every 6 (six) hours as needed (mild pain).           Follow-up Information    Follow up with [facility] in [place]. .   Why:  physical therapy   Contact information:   [phone]      Follow up with Gearlean Alf, MD. Schedule an appointment as soon as possible for a visit on 10/07/2015.   Specialty:  Orthopedic Surgery   Why:  Call office at [phone] to setup appointment on Friday 10/07/2015 with Dr. Wynelle Link.  Contact information:   [address] [place] [address] [phone]       Signed: Arlee Muslim, PA-C Orthopaedic Surgery 09/22/2015, 8:53 PM

## 2015-09-23 LAB — BASIC METABOLIC PANEL
ANION GAP: 7 (ref 5–15)
BUN: 13 mg/dL (ref 6–20)
CO2: 27 mmol/L (ref 22–32)
Calcium: 8.4 mg/dL — ABNORMAL LOW (ref 8.9–10.3)
Chloride: 104 mmol/L (ref 101–111)
Creatinine, Ser: 0.58 mg/dL (ref 0.44–1.00)
GFR calc non Af Amer: >60 mL/min (ref >60)
Glucose, Bld: 132 mg/dL — ABNORMAL HIGH (ref 65–99)
POTASSIUM: 4.1 mmol/L (ref 3.5–5.1)
Sodium: 138 mmol/L (ref 135–145)

## 2015-09-23 LAB — CBC
HEMATOCRIT: 33.5 % — AB (ref 36.0–46.0)
Hemoglobin: 10.7 g/dL — ABNORMAL LOW (ref 12.0–15.0)
MCH: 27 pg (ref 26.0–34.0)
MCHC: 31.9 g/dL (ref 30.0–36.0)
MCV: 84.4 fL (ref 78.0–100.0)
PLATELETS: 216 10*3/uL (ref 150–400)
RBC: 3.97 MIL/uL (ref 3.87–5.11)
RDW: 16.4 % — AB (ref 11.5–15.5)
WBC: 14.8 10*3/uL — AB (ref 4.0–10.5)

## 2015-09-23 NOTE — Progress Notes (Signed)
Physical Therapy Treatment Patient Details Name: Michaela Morris MRN: [id number] DOB: 25-Oct-1945 Today's Date: Morris    History of Present Illness Pt is a 69 year old female s/p RIGHT TOTAL HIP ARTHROPLASTY ANTERIOR APPROACH     PT Comments    Pt ambulated in hallway and reports increased soreness since yesterday so limited distance.  Pt practiced safe stair technique.  Did not perform exercises to conserve energy and keep pain controlled as pt to d/c home today and states she will perform once settled at home.  Pt had no further questions/concerns and feels ready for d/c.   Follow Up Recommendations  Home health PT;Supervision/Assistance - 24 hour     Equipment Recommendations  None recommended by PT    Recommendations for Other Services       Precautions / Restrictions Precautions Precautions: Fall    Mobility  Bed Mobility Overal bed mobility: Needs Assistance Bed Mobility: Supine to Sit     Supine to sit: Supervision     General bed mobility comments: increased time and effort today, pt reports increased pain last night, able to self assist R LE to EOB  Transfers Overall transfer level: Needs assistance Equipment used: Rolling walker (2 wheeled) Transfers: Sit to/from Stand Sit to Stand: Supervision            Ambulation/Gait Ambulation/Gait assistance: Supervision Ambulation Distance (Feet): 150 Feet Assistive device: Rolling walker (2 wheeled) Gait Pattern/deviations: Step-through pattern;Antalgic;Decreased stance time - right     General Gait Details: pt reports she "over did it" yesterday so encouraged shorter stride and increased UE WBing to assist with pain/sore control, followed with recliner for ride back to room after steps   Stairs Stairs: Yes Stairs assistance: Min guard Stair Management: Step to pattern;Sideways;Backwards;With walker;One rail Right Number of Stairs: 2 General stair comments: performed twice with cues for safety,  technique, sequence; backwards with RW and then semisideways with R rail (prefers R rail version), pt states she feels comfortable with steps to get into daughter's home  Wheelchair Mobility    Modified Rankin (Stroke Patients Only)       Balance                                    Cognition Arousal/Alertness: Awake/alert Behavior During Therapy: WFL for tasks assessed/performed Overall Cognitive Status: Within Functional Limits for tasks assessed                      Exercises      General Comments        Pertinent Vitals/Pain Pain Assessment: 0-10 Pain Score: 5  Pain Location: R hip Pain Descriptors / Indicators: Sore;Discomfort Pain Intervention(s): Limited activity within patient's tolerance;Monitored during session;Repositioned    Home Living                      Prior Function            PT Goals (current goals can now be found in the care plan section) Progress towards PT goals: Progressing toward goals    Frequency  7X/week    PT Plan Current plan remains appropriate    Co-evaluation             End of Session Equipment Utilized During Treatment: Gait belt Activity Tolerance: Patient tolerated treatment well Patient left: with call bell/phone within reach;in chair     Time: [id number]  PT Time Calculation (min) (ACUTE ONLY): 19 min  Charges:  $Gait Training: 8-22 mins                    G Codes:      Michaela Morris,Michaela Morris, 12:38 PM Michaela Morris, PT, DPT Morris Pager: [phone]

## 2015-09-23 NOTE — Progress Notes (Signed)
   Subjective: 2 Days Post-Op Procedure(s) (LRB): RIGHT TOTAL HIP ARTHROPLASTY ANTERIOR APPROACH (Right) Patient reports pain as mild.   Patient seen in rounds with [name]. Patient is well, and has had no acute complaints or problems Patient is ready to go home  Objective: Vital signs in last 24 hours: Temp:  [98.4 F (36.9 C)-99.7 F (37.6 C)] 98.9 F (37.2 C) (12/09 0549) Pulse Rate:  [65-89] 65 (12/09 0549) Resp:  [18-20] 18 (12/09 0549) BP: (113-126)/(39-54) 113/39 mmHg (12/09 0549) SpO2:  [97 %-99 %] 99 % (12/09 0549)  Intake/Output from previous day:  Intake/Output Summary (Last 24 hours) at 09/23/15 0909 Last data filed at 09/23/15 0843  Gross per 24 hour  Intake 2355.08 ml  Output   3950 ml  Net -1594.92 ml    Intake/Output this shift: Total I/O In: 480 [P.O.:480] Out: -   Labs:  Recent Labs  09/22/15 0512 09/23/15 0545  HGB 11.8* 10.7*    Recent Labs  09/22/15 0512 09/23/15 0545  WBC 17.7* 14.8*  RBC 4.45 3.97  HCT 37.8 33.5*  PLT 245 216    Recent Labs  09/22/15 0512 09/23/15 0545  NA 138 138  K 3.9 4.1  CL 104 104  CO2 27 27  BUN 11 13  CREATININE 0.62 0.58  GLUCOSE 174* 132*  CALCIUM 8.8* 8.4*   No results for input(s): LABPT, INR in the last 72 hours.  EXAM: General - Patient is Alert and Appropriate Extremity - Neurovascular intact Sensation intact distally Incision - clean, dry Motor Function - intact, moving foot and toes well on exam.   Assessment/Plan: 2 Days Post-Op Procedure(s) (LRB): RIGHT TOTAL HIP ARTHROPLASTY ANTERIOR APPROACH (Right) Procedure(s) (LRB): RIGHT TOTAL HIP ARTHROPLASTY ANTERIOR APPROACH (Right) Past Medical History  Diagnosis Date  . Spondylolisthesis     hx of   . Degenerative lumbar disc   . Bursitis of left hip   . Sleep apnea     uses CPAP  . Shortness of breath dyspnea     walking distances or climbing stairs  . Bronchitis     hx of   . Hypothyroidism   . GERD (gastroesophageal  reflux disease)   . Hypercholesteremia   . Asthma   . Osteoarthritis     left knee/left hip   . 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   Weight as of this encounter: 105.235 kg (232 lb). Up with therapy Discharge home with home health Diet - Cardiac diet Follow up - in 2 weeks Activity - WBAT Disposition - Home Condition Upon Discharge - Good D/C Meds - See DC Summary DVT Prophylaxis - Xarelto  Avel Peacerew Agostino Gorin, PA-C Orthopaedic Surgery 09/23/2015, 9:09 AM

## 2015-09-26 NOTE — Progress Notes (Addendum)
  Late entry for 09/22/15 treatment 09/22/15 1435  PT Visit Information/   Assistance Needed +1  History of Present Illness RIGHT TOTAL HIP ARTHROPLASTY ANTERIOR APPROACH (Right  Precautions  Precautions Fall  Bed Mobility  Overal bed mobility Modified Independent  Transfers  Equipment used Rolling walker (2 wheeled)  Transfers Sit to/from Stand  Sit to Stand Supervision  Ambulation/Gait  Ambulation/Gait assistance Supervision  Ambulation Distance (Feet) 420 Feet  Assistive device Rolling walker (2 wheeled)  Gait Pattern/deviations Step-through pattern  General Gait Details swing on R leg is good.  PT - End of Session  Activity Tolerance Patient tolerated treatment well  Patient left in bed;with call bell/phone within reach;with bed alarm set  Nurse Communication Mobility status  PT - Assessment/Plan  PT Plan Current plan remains appropriate  PT Frequency (ACUTE ONLY) 7X/week  Follow Up Recommendations Home health PT;Supervision/Assistance - 24 hour  PT equipment None recommended by PT  Blanchard KelchKaren Bailei Buist PT [phone]

## 2016-02-09 IMAGING — DX DG PORTABLE PELVIS
1 series · 1 of 1 positions shown · non-contrast
Comparison: None.

CLINICAL DATA: Status post RIGHT THR.

EXAM:
DG C-ARM 1-60 MIN-NO REPORT; PORTABLE PELVIS 1-2 VIEWS

[pelvis ap]
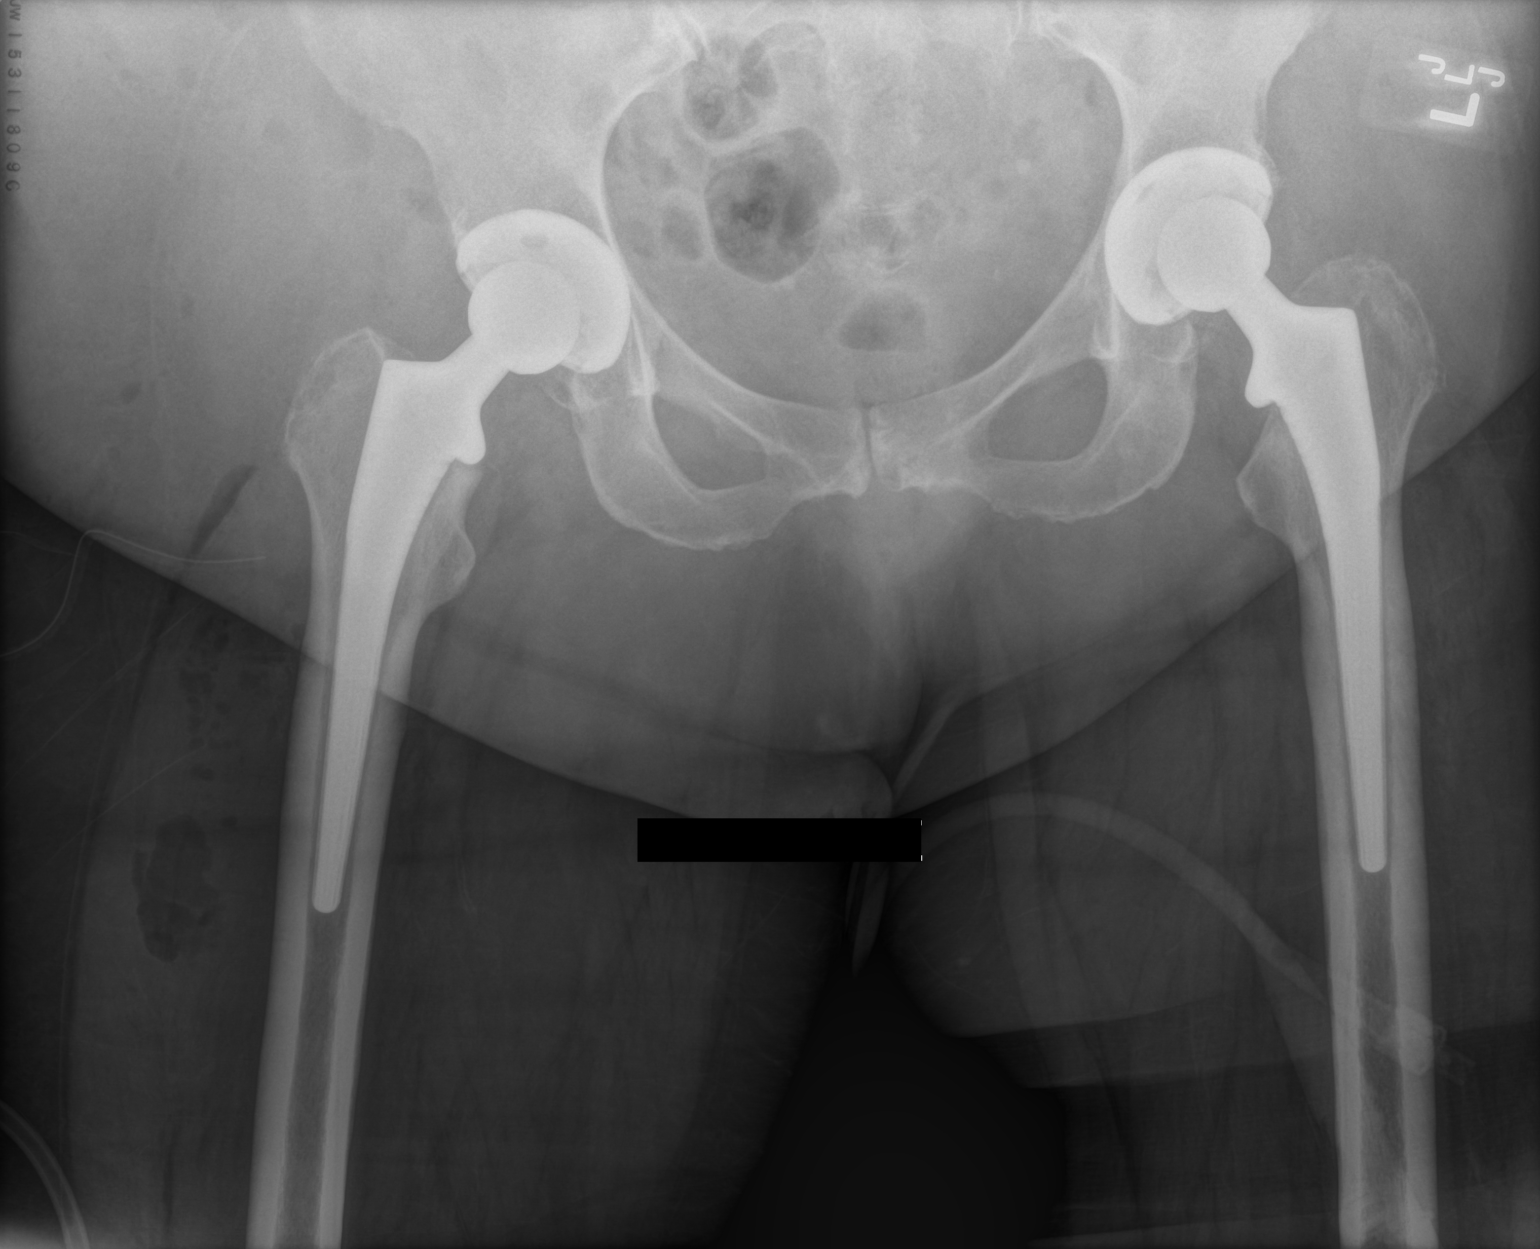

[1 of 1 positions shown; findings below may reference images not displayed]

FINDINGS: There is no evidence of pelvic fracture or diastasis. No pelvic bone
lesions are seen. Status post RIGHT THR earlier today and previous
LEFT THR. Satisfactory position and alignment.
IMPRESSION: No adverse features.

## 2022-11-13 ENCOUNTER — Other Ambulatory Visit: Payer: Self-pay | Admitting: Neurological Surgery

## 2022-11-15 ENCOUNTER — Other Ambulatory Visit: Payer: Self-pay | Admitting: Neurological Surgery

## 2022-11-20 ENCOUNTER — Other Ambulatory Visit: Payer: Self-pay | Admitting: Neurological Surgery

## 2022-11-20 NOTE — Pre-Procedure Instructions (Signed)
Surgical Instructions    Your procedure is scheduled on Tuesday, February 13th.  Report to [facility] Main Entrance "A" at 7:30 A.M., then check in with the Admitting office.  Call this number if you have problems the morning of surgery:  [phone]  If you have any questions prior to your surgery date call [phone]: Open Monday-Friday 8am-4pm If you experience any cold or flu symptoms such as cough, fever, chills, shortness of breath, etc. between now and your scheduled surgery, please notify us at the above number.     Remember:  Do not eat or drink after midnight the night before your surgery    Take these medicines the morning of surgery with A SIP OF WATER  amLODipine (NORVASC)  fluticasone (FLONASE)  SYNTHROID   Follow your surgeon's instructions on when to stop Aspirin.  If no instructions were given by your surgeon then you will need to call the office to get those instructions.    As of today, STOP taking any Aleve, Naproxen, Ibuprofen, Motrin, Advil, Goody's, BC's, all herbal medications, fish oil, and all vitamins. This includes: celecoxib (CELEBREX).  WHAT DO I DO ABOUT MY DIABETES MEDICATION?   Do not take metFORMIN (GLUCOPHAGE) the morning of surgery.   HOW TO MANAGE YOUR DIABETES BEFORE AND AFTER SURGERY  Why is it important to control my blood sugar before and after surgery? Improving blood sugar levels before and after surgery helps healing and can limit problems. A way of improving blood sugar control is eating a healthy diet by:  Eating less sugar and carbohydrates  Increasing activity/exercise  Talking with your doctor about reaching your blood sugar goals High blood sugars (greater than 180 mg/dL) can raise your risk of infections and slow your recovery, so you will need to focus on controlling your diabetes during the weeks before surgery. Make sure that the doctor who takes care of your diabetes knows about your planned surgery including the date  and location.  How do I manage my blood sugar before surgery? Check your blood sugar at least 4 times a day, starting 2 days before surgery, to make sure that the level is not too high or low.  Check your blood sugar the morning of your surgery when you wake up and every 2 hours until you get to the Short Stay unit.  If your blood sugar is less than 70 mg/dL, you will need to treat for low blood sugar: Do not take insulin. Treat a low blood sugar (less than 70 mg/dL) with  cup of clear juice (cranberry or apple), 4 glucose tablets, OR glucose gel. Recheck blood sugar in 15 minutes after treatment (to make sure it is greater than 70 mg/dL). If your blood sugar is not greater than 70 mg/dL on recheck, call [phone] for further instructions. Report your blood sugar to the short stay nurse when you get to Short Stay.  If you are admitted to the hospital after surgery: Your blood sugar will be checked by the staff and you will probably be given insulin after surgery (instead of oral diabetes medicines) to make sure you have good blood sugar levels. The goal for blood sugar control after surgery is 80-180 mg/dL.                      Do NOT Smoke (Tobacco/Vaping) for 24 hours prior to your procedure.  If you use a CPAP at night, you may bring your mask/headgear for your overnight stay.  Contacts, glasses, piercing's, hearing aid's, dentures or partials may not be worn into surgery, please bring cases for these belongings.    For patients admitted to the hospital, discharge time will be determined by your treatment team.   Patients discharged the day of surgery will not be allowed to drive home, and someone needs to stay with them for 24 hours.  SURGICAL WAITING ROOM VISITATION Patients having surgery or a procedure may have no more than 2 support people in the waiting area - these visitors may rotate.   Children under the age of 41 must have an adult with them who is not the  patient. If the patient needs to stay at the hospital during part of their recovery, the visitor guidelines for inpatient rooms apply. Pre-op nurse will coordinate an appropriate time for 1 support person to accompany patient in pre-op.  This support person may not rotate.   Please refer to the [facility] website for the visitor guidelines for Inpatients (after your surgery is over and you are in a regular room).    Special instructions:   [facility]- Preparing For Surgery  Before surgery, you can play an important role. Because skin is not sterile, your skin needs to be as free of germs as possible. You can reduce the number of germs on your skin by washing with CHG (chlorahexidine gluconate) Soap before surgery.  CHG is an antiseptic cleaner which kills germs and bonds with the skin to continue killing germs even after washing.    Oral Hygiene is also important to reduce your risk of infection.  Remember - BRUSH YOUR TEETH THE MORNING OF SURGERY WITH YOUR REGULAR TOOTHPASTE  Please do not use if you have an allergy to CHG or antibacterial soaps. If your skin becomes reddened/irritated stop using the CHG.  Do not shave (including legs and underarms) for at least 48 hours prior to first CHG shower. It is OK to shave your face.  Please follow these instructions carefully.   Shower the NIGHT BEFORE SURGERY and the MORNING OF SURGERY  If you chose to wash your hair, wash your hair first as usual with your normal shampoo.  After you shampoo, rinse your hair and body thoroughly to remove the shampoo.  Use CHG Soap as you would any other liquid soap. You can apply CHG directly to the skin and wash gently with a scrungie or a clean washcloth.   Apply the CHG Soap to your body ONLY FROM THE NECK DOWN.  Do not use on open wounds or open sores. Avoid contact with your eyes, ears, mouth and genitals (private parts). Wash Face and genitals (private parts)  with your normal soap.   Wash thoroughly,  paying special attention to the area where your surgery will be performed.  Thoroughly rinse your body with warm water from the neck down.  DO NOT shower/wash with your normal soap after using and rinsing off the CHG Soap.  Pat yourself dry with a CLEAN TOWEL.  Wear CLEAN PAJAMAS to bed the night before surgery  Place CLEAN SHEETS on your bed the night before your surgery  DO NOT SLEEP WITH PETS.   Day of Surgery: Take a shower with CHG soap. Do not wear jewelry or makeup Do not wear lotions, powders, perfumes, or deodorant. Do not shave 48 hours prior to surgery.  Do not bring valuables to the hospital.  [facility] is not responsible for any belongings or valuables. Do not wear nail polish, gel polish,  artificial nails, or any other type of covering on natural nails (fingers and toes) If you have artificial nails or gel coating that need to be removed by a nail salon, please have this removed prior to surgery. Artificial nails or gel coating may interfere with anesthesia's ability to adequately monitor your vital signs. Wear Clean/Comfortable clothing the morning of surgery Remember to brush your teeth WITH YOUR REGULAR TOOTHPASTE.   Please read over the following fact sheets that you were given.    If you received a COVID test during your pre-op visit  it is requested that you wear a mask when out in public, stay away from anyone that may not be feeling well and notify your surgeon if you develop symptoms. If you have been in contact with anyone that has tested positive in the last 10 days please notify you surgeon.

## 2022-11-21 ENCOUNTER — Encounter (HOSPITAL_COMMUNITY)
Admission: RE | Admit: 2022-11-21 | Discharge: 2022-11-21 | Disposition: A | Discharge status: Home / Self Care | Payer: Medicare Other | Source: Ambulatory Visit | Attending: Neurological Surgery | Admitting: Neurological Surgery

## 2022-11-21 ENCOUNTER — Other Ambulatory Visit: Payer: Self-pay

## 2022-11-21 ENCOUNTER — Encounter (HOSPITAL_COMMUNITY): Payer: Self-pay

## 2022-11-21 VITALS — BP 155/57 | HR 74 | Temp 98.0°F | Resp 18 | Ht 62.0 in | Wt 214.8 lb

## 2022-11-21 DIAGNOSIS — Z01818 Encounter for other preprocedural examination: Secondary | ICD-10-CM | POA: Diagnosis present

## 2022-11-21 DIAGNOSIS — E119 Type 2 diabetes mellitus without complications: Secondary | ICD-10-CM | POA: Diagnosis not present

## 2022-11-21 HISTORY — DX: Essential (primary) hypertension: I10

## 2022-11-21 HISTORY — DX: Type 2 diabetes mellitus without complications: E11.9

## 2022-11-21 LAB — CBC
HCT: 43.7 % (ref 36.0–46.0)
Hemoglobin: 13.6 g/dL (ref 12.0–15.0)
MCH: 29.1 pg (ref 26.0–34.0)
MCHC: 31.1 g/dL (ref 30.0–36.0)
MCV: 93.4 fL (ref 80.0–100.0)
Platelets: 278 10*3/uL (ref 150–400)
RBC: 4.68 MIL/uL (ref 3.87–5.11)
RDW: 14.2 % (ref 11.5–15.5)
WBC: 9.3 10*3/uL (ref 4.0–10.5)
nRBC: 0 % (ref 0.0–0.2)

## 2022-11-21 LAB — BASIC METABOLIC PANEL
Anion gap: 11 (ref 5–15)
BUN: 13 mg/dL (ref 8–23)
CO2: 25 mmol/L (ref 22–32)
Calcium: 9.2 mg/dL (ref 8.9–10.3)
Chloride: 102 mmol/L (ref 98–111)
Creatinine, Ser: 0.77 mg/dL (ref 0.44–1.00)
GFR, Estimated: >60 mL/min (ref >60)
Glucose, Bld: 126 mg/dL — ABNORMAL HIGH (ref 70–99)
Potassium: 4.3 mmol/L (ref 3.5–5.1)
Sodium: 138 mmol/L (ref 135–145)

## 2022-11-21 LAB — PROTIME-INR
INR: 0.9 (ref 0.8–1.2)
Prothrombin Time: 12.3 seconds (ref 11.4–15.2)

## 2022-11-21 LAB — GLUCOSE, CAPILLARY: Glucose-Capillary: 133 mg/dL — ABNORMAL HIGH (ref 70–99)

## 2022-11-21 LAB — TYPE AND SCREEN
ABO/RH(D): O POS
Antibody Screen: NEGATIVE

## 2022-11-21 LAB — HEMOGLOBIN A1C
Hgb A1c MFr Bld: 6.6 % — ABNORMAL HIGH (ref 4.8–5.6)
Mean Plasma Glucose: 142.72 mg/dL

## 2022-11-21 LAB — SURGICAL PCR SCREEN
MRSA, PCR: NEGATIVE
Staphylococcus aureus: NEGATIVE

## 2022-11-21 LAB — APTT: aPTT: 29 seconds (ref 24–36)

## 2022-11-21 NOTE — Progress Notes (Signed)
PCP - Dr. Moshe Cipro- Records Requested Cardiologist - Dr. Neale Burly- Records Requested  PPM/ICD - n/a  Chest x-ray -  EKG - 05/01/22. Care Everywhere. Tracing Requested Stress Test - 2018 ECHO - 05/12/21 Cardiac Cath - denies  Sleep Study - +OSA. Wears CPAP nightly. Does not know pressure settings.    Fasting Blood Sugar - 120 Checks Blood Sugar once a day.  CBG today- 133 Do not take Metformin the morning of surgery.  Last dose of GLP1 agonist- n/a GLP1 instructions: n/a  Blood Thinner Instructions: n/a Aspirin Instructions: Patient states last dose of Aspirin was taken on 11/19/22.  ERAS Protcol - No. NPO  COVID TEST- n/a   Anesthesia review: Yes. Spoke to Sigmund Hazel, PA during PAT appointment. 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 Patient states Dr. Reatha Armour ordered a factor 9 test pre-op to make sure the level is not too low and if it is surgery may be cancelled. Records requested from Dr. Currie Paris and Dr. Sharlett Iles.   Patient denies shortness of breath, fever, cough and chest pain at PAT appointment   All instructions explained to the patient, with a verbal understanding of the material. Patient agrees to go over the instructions while at home for a better understanding. The opportunity to ask questions was provided.

## 2022-11-22 LAB — FACTOR 9 ASSAY: Coagulation Factor IX: 61 % (ref 60–177)

## 2022-11-22 NOTE — Progress Notes (Signed)
Anesthesia Chart Review:  Case: [id number] Date/Time: 11/27/22 0915   Procedures:      OPEN LUMBAR DECOMP,TLIF,POSTEROLATERAL ARTHRODESIS, L34, [id number], [id number]     Application of O-Arm   Anesthesia type: General   Pre-op diagnosis: SPONDYLOLISTHESIS OF LUMBAR REGION   Location: [place] OR ROOM 72 / [place] OR   Surgeons: Dawley, Theodoro Doing, DO       DISCUSSION: Patient is a 77 year old female scheduled for the above procedure.  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 Due to this history she gets preoperative Factor 9 levels prior to surgery per previous recommendation. Dr. Reatha Armour ordered. 11/21/22 result showed: Factor 9 assay was 61% (60-177). PT/PTT normal.   She is followed by cardiologist Dr. Neale Burly with [facility]. Initial consult was on 05/01/21 for episode of chest pain. She also reported some exertional DOE with stairs. A CT cardiac was done on 05/12/21 and showed Coronary calcium score of 235 (74 th percentile) with mild ostial LM disease and minimal proximal LAD disease, normal DIAG, OM, RCA and CX. Echo showed LVEF 54-55%, trivial PR/TR. Cardiac monitor showed SB-SR with frequent supraventricular ectopy. Her last office visit was on 05/01/22. She reported back issues limited her activity and had discontinued water aerobics. She also reported some fatigue, not necessarily exertional.  EKG recommended to help determine if additional cardiac testing indicated. By narrative, EKG showed SB, LAFB, LVH, late precordial R/S transition. No additional testing ordered.   At her PAT RN visit, she denied chest pain but still with some chronic fatigue and exertional dyspnea. She feels it is overall stable and attributes it to her  breath holding due to pain. Activity is limited currently. She is able to walk to her mailbox. She did indicate recent evaluation with Dr. Currie Paris ([place]). Surgeon's office faxed a 11/09/22 encounter by Dr. Currie Paris that gave permission to hold ASA for 7 days prior to surgery and advised she hold metformin the night before surgery. Last ASA 11/19/22.   Discussed with anesthesiologist Nolon Nations, MD. Preoperative cardiology input recommended. I communicated this with Lexine Baton at Dr. Timothy Lasso office on 11/22/22.   ADDENDUM 11/26/22 2:38 PM: I received a signed cardiology clearance letter addressed to Dr. Neale Burly classifying patient has "Moderate Risk" with permission to hold ASA and resume when bleeding risk is low. Anesthesia team to evaluate on the day of surgery.    VS: BP (!) 155/57   Pulse 74   Temp 36.7 C (Oral)   Resp 18   Ht 5' 2"$  (1.575 m)   Wt 97.4 kg   SpO2 95%   BMI 39.29 kg/m    PROVIDERS: Moshe Cipro, MD is PCP  Neale Burly, MD is cardiologist   LABS: Preoperative labs noted.  (all labs ordered are listed, but only abnormal results are displayed)  Labs Reviewed  BASIC METABOLIC PANEL - Abnormal; Notable for the following components:      Result Value   Glucose, Bld 126 (*)    All other components within normal limits  HEMOGLOBIN A1C - Abnormal; Notable for the following components:   Hgb A1c MFr Bld 6.6 (*)    All other components within normal limits  GLUCOSE, CAPILLARY - Abnormal; Notable for the following components:  Glucose-Capillary 133 (*)    All other components within normal limits  SURGICAL PCR SCREEN  FACTOR 9 ASSAY  PROTIME-INR  APTT  CBC  TYPE AND SCREEN     IMAGES: MRI L-spine 10/09/22: Images in [facility]/PACS.   EKG: EKG 05/01/22 ([facility]): Tracing requested. By Narrative in Care Everywhere: Sinus bradycardia  Left anterior fascicular block  Left ventricular hypertrophy  Late precordial R/S transition  Abnormal  ECG  - When compared with ECG of 01-May-2021 11:04, Nonspecific inferior T wave abnormalities are now absent  I reviewed and concur with this report. Electronically signed [id number], MD, NEIL ([id number]) on 05/01/2022 9:56:17 PM   CV: Cardiac Holter Monitor > 7 days ([facility] CE): 1. Study was adequate for interpretation 2. The observed rhythms are sinus bradycardia to sinus tachycardia with frequent supraventricular ectopy. 3. <1% PVCs 4. Patient reported event related to a sinus mechanism +/- PACs   Echo 05/12/21 ([facility]):   NORMAL LEFT VENTRICULAR SYSTOLIC FUNCTION WITH MILD LVH. LVEF estimated > 55%, calculated 54%   NORMAL LA PRESSURES WITH NORMAL DIASTOLIC FUNCTION    NORMAL RIGHT VENTRICULAR SYSTOLIC FUNCTION    VALVULAR REGURGITATION: TRIVIAL PR, TRIVIAL TR    NO VALVULAR STENOSIS    NO PRIOR STUDY FOR COMPARISON    CT Coronary 05/12/21 ([facility]): There is right dominance of the coronary arterial circulation.  The coronary artery ostia are in normal location.  - Left Main: Course: Normal. Size: Normal.  Atherosclerosis: Calcified plaque at the origin of the left main coronary artery. Stenosis: Mild stenosis at the origin.  - Left Anterior Descending: Course: Normal. Size: Normal. Atherosclerosis: Minimal calcified plaque at the proximal aspect of the LAD with positive remodeling. Stenosis: None.  - Diagonals (D1 and D2) Course: Normal.  Size: Normal. Atherosclerosis: None. Stenosis: None.  - Left Circumflex: Course: Normal. Size: Normal. Atherosclerosis: None. Stenosis: None.  - Obtuse Marginals (OM1) Course: Normal.  Size: Normal. Atherosclerosis: None. Stenosis: None.  - Right Coronary Artery: Course: Normal.  Size: Normal. Atherosclerosis: None. Stenosis: None.   Impression:  1. Calcified plaques at the origin of the left main coronary artery and  within the LAD resulting in up to mild stenosis (CAD-RADS 1).  2. Coronary calcium score of 235 corresponding to the 74th percentile for   patient of this demographic.  3. 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    Bronchitis    hx of    Bursitis of left hip    Degenerative lumbar disc    Diabetes mellitus without complication (HCC)    GERD (gastroesophageal reflux disease)    Hypercholesteremia    Hypertension    Hypothyroidism    Osteoarthritis    left knee/left hip    Shortness of breath dyspnea    walking distances or climbing stairs   Sleep apnea    uses CPAP   Spondylolisthesis    hx of     Past Surgical History:  Procedure Laterality Date   ABDOMINAL HYSTERECTOMY     06/1988    APPENDECTOMY     arthroscopy of shoulder      right    BREAST SURGERY     breast biopsy / left    colonscopy      removal of polyp    DILATION AND CURETTAGE OF UTERUS     ROTATOR  CUFF REPAIR     right    TOTAL HIP ARTHROPLASTY Left 12/01/2014   Procedure: LEFT TOTAL HIP ARTHROPLASTY ANTERIOR APPROACH;  Surgeon: Gearlean Alf, MD;  Location: [facility];  Service: Orthopedics;  Laterality: Left;   TOTAL HIP ARTHROPLASTY Right 09/21/2015   Procedure: RIGHT TOTAL HIP ARTHROPLASTY ANTERIOR APPROACH;  Surgeon: Gaynelle Arabian, MD;  Location: [facility];  Service: Orthopedics;  Laterality: Right;    MEDICATIONS:  amLODipine (NORVASC) 5 MG tablet   aspirin EC 81 MG tablet   betamethasone dipropionate 0.05 % cream   celecoxib (CELEBREX) 200 MG capsule   Cholecalciferol (VITAMIN D3) 125 MCG (5000 UT) TABS   Coenzyme Q10 (COQ10 PO)   Cyanocobalamin (VITAMIN B12 SL)   desonide (DESOWEN) 0.05 % cream   ELDERBERRY PO   estradiol (ESTRACE) 1 MG tablet   fluticasone (FLONASE) 50 MCG/ACT nasal spray   losartan (COZAAR) 100 MG tablet   metFORMIN (GLUCOPHAGE) 1000 MG tablet   MILK THISTLE PLUS PO   montelukast (SINGULAIR) 10 MG tablet   polyethylene glycol (MIRALAX / GLYCOLAX) 17 g packet   rosuvastatin  (CRESTOR) 5 MG tablet   SYNTHROID 137 MCG tablet   TURMERIC PO   No current facility-administered medications for this encounter.    Myra Gianotti, PA-C Surgical Short Stay/Anesthesiology [facility] Phone [phone] [facility] Phone [phone] 11/23/2022 5:46 PM

## 2022-11-22 NOTE — Progress Notes (Signed)
Michaela Morris's Factor 9 that was drawn 11/21/22, resulted today, it was 6.  I called the result to [facility], I left the result on [facility] voice mail.

## 2022-11-26 NOTE — Anesthesia Preprocedure Evaluation (Addendum)
Anesthesia Evaluation  Patient identified by MRN, date of birth, ID band Patient awake    Reviewed: Allergy & Precautions, NPO status , Patient's Chart, lab work & pertinent test results  History of Anesthesia Complications Negative for: history of anesthetic complications  Airway Mallampati: II  TM Distance: >3 FB Neck ROM: Full    Dental  (+) Dental Advisory Given, Teeth Intact   Pulmonary asthma , sleep apnea and Continuous Positive Airway Pressure Ventilation , former smoker   Pulmonary exam normal        Cardiovascular hypertension, Pt. on medications Normal cardiovascular exam     Neuro/Psych negative neurological ROS  negative psych ROS   GI/Hepatic Neg liver ROS,GERD  Controlled,,  Endo/Other  diabetes, Type 2, Oral Hypoglycemic AgentsHypothyroidism   Obesity   Renal/GU negative Renal ROS     Musculoskeletal  (+) Arthritis , Osteoarthritis,    Abdominal   Peds  Hematology negative hematology ROS (+)   Anesthesia Other Findings   Reproductive/Obstetrics                             Anesthesia Physical Anesthesia Plan  ASA: 3  Anesthesia Plan: General   Post-op Pain Management: Tylenol PO (pre-op)*   Induction: Intravenous  PONV Risk Score and Plan: 3 and Treatment may vary due to age or medical condition, Ondansetron and Propofol infusion  Airway Management Planned: Oral ETT  Additional Equipment: None  Intra-op Plan:   Post-operative Plan: Extubation in OR  Informed Consent: I have reviewed the patients History and Physical, chart, labs and discussed the procedure including the risks, benefits and alternatives for the proposed anesthesia with the patient or authorized representative who has indicated his/her understanding and acceptance.     Dental advisory given  Plan Discussed with: CRNA and Anesthesiologist  Anesthesia Plan Comments: (See PAT note )        Anesthesia Quick Evaluation

## 2022-11-27 ENCOUNTER — Inpatient Hospital Stay (HOSPITAL_COMMUNITY): Payer: Medicare Other | Admitting: Anesthesiology

## 2022-11-27 ENCOUNTER — Other Ambulatory Visit: Payer: Self-pay

## 2022-11-27 ENCOUNTER — Inpatient Hospital Stay (HOSPITAL_COMMUNITY): Payer: Medicare Other

## 2022-11-27 ENCOUNTER — Encounter (HOSPITAL_COMMUNITY): Payer: Self-pay | Admitting: Neurological Surgery

## 2022-11-27 ENCOUNTER — Inpatient Hospital Stay (HOSPITAL_COMMUNITY)
Admission: RE | Admit: 2022-11-27 | Discharge: 2022-11-29 | DRG: 453 | Disposition: A | Discharge status: Home / Self Care | Payer: Medicare Other | Attending: Neurological Surgery | Admitting: Neurological Surgery

## 2022-11-27 ENCOUNTER — Inpatient Hospital Stay (HOSPITAL_COMMUNITY)
Admission: RE | Disposition: A | Discharge status: Home / Self Care | Payer: Self-pay | Source: Home / Self Care | Attending: Neurological Surgery

## 2022-11-27 ENCOUNTER — Inpatient Hospital Stay (HOSPITAL_COMMUNITY): Payer: Medicare Other | Admitting: Vascular Surgery

## 2022-11-27 DIAGNOSIS — M4726 Other spondylosis with radiculopathy, lumbar region: Secondary | ICD-10-CM | POA: Diagnosis present

## 2022-11-27 DIAGNOSIS — J45909 Unspecified asthma, uncomplicated: Secondary | ICD-10-CM | POA: Diagnosis present

## 2022-11-27 DIAGNOSIS — M4727 Other spondylosis with radiculopathy, lumbosacral region: Secondary | ICD-10-CM | POA: Diagnosis present

## 2022-11-27 DIAGNOSIS — Z8249 Family history of ischemic heart disease and other diseases of the circulatory system: Secondary | ICD-10-CM

## 2022-11-27 DIAGNOSIS — Z79899 Other long term (current) drug therapy: Secondary | ICD-10-CM | POA: Diagnosis not present

## 2022-11-27 DIAGNOSIS — I10 Essential (primary) hypertension: Secondary | ICD-10-CM | POA: Diagnosis present

## 2022-11-27 DIAGNOSIS — M48062 Spinal stenosis, lumbar region with neurogenic claudication: Secondary | ICD-10-CM | POA: Diagnosis present

## 2022-11-27 DIAGNOSIS — M5117 Intervertebral disc disorders with radiculopathy, lumbosacral region: Secondary | ICD-10-CM | POA: Diagnosis present

## 2022-11-27 DIAGNOSIS — Z90710 Acquired absence of both cervix and uterus: Secondary | ICD-10-CM

## 2022-11-27 DIAGNOSIS — D67 Hereditary factor IX deficiency: Secondary | ICD-10-CM | POA: Diagnosis present

## 2022-11-27 DIAGNOSIS — K219 Gastro-esophageal reflux disease without esophagitis: Secondary | ICD-10-CM | POA: Diagnosis present

## 2022-11-27 DIAGNOSIS — Z6839 Body mass index (BMI) 39.0-39.9, adult: Secondary | ICD-10-CM

## 2022-11-27 DIAGNOSIS — E039 Hypothyroidism, unspecified: Secondary | ICD-10-CM | POA: Diagnosis present

## 2022-11-27 DIAGNOSIS — M4807 Spinal stenosis, lumbosacral region: Secondary | ICD-10-CM | POA: Diagnosis present

## 2022-11-27 DIAGNOSIS — M4317 Spondylolisthesis, lumbosacral region: Secondary | ICD-10-CM | POA: Diagnosis present

## 2022-11-27 DIAGNOSIS — Z7984 Long term (current) use of oral hypoglycemic drugs: Secondary | ICD-10-CM

## 2022-11-27 DIAGNOSIS — Z96643 Presence of artificial hip joint, bilateral: Secondary | ICD-10-CM | POA: Diagnosis present

## 2022-11-27 DIAGNOSIS — Z832 Family history of diseases of the blood and blood-forming organs and certain disorders involving the immune mechanism: Secondary | ICD-10-CM

## 2022-11-27 DIAGNOSIS — Z87891 Personal history of nicotine dependence: Secondary | ICD-10-CM

## 2022-11-27 DIAGNOSIS — E7800 Pure hypercholesterolemia, unspecified: Secondary | ICD-10-CM | POA: Diagnosis present

## 2022-11-27 DIAGNOSIS — E669 Obesity, unspecified: Secondary | ICD-10-CM | POA: Diagnosis present

## 2022-11-27 DIAGNOSIS — M5116 Intervertebral disc disorders with radiculopathy, lumbar region: Secondary | ICD-10-CM | POA: Diagnosis present

## 2022-11-27 DIAGNOSIS — E119 Type 2 diabetes mellitus without complications: Secondary | ICD-10-CM

## 2022-11-27 DIAGNOSIS — M4316 Spondylolisthesis, lumbar region: Secondary | ICD-10-CM

## 2022-11-27 DIAGNOSIS — E1151 Type 2 diabetes mellitus with diabetic peripheral angiopathy without gangrene: Secondary | ICD-10-CM | POA: Diagnosis present

## 2022-11-27 DIAGNOSIS — Z7982 Long term (current) use of aspirin: Secondary | ICD-10-CM

## 2022-11-27 DIAGNOSIS — Z79890 Hormone replacement therapy (postmenopausal): Secondary | ICD-10-CM | POA: Diagnosis not present

## 2022-11-27 HISTORY — PX: TRANSFORAMINAL LUMBAR INTERBODY FUSION (TLIF) WITH PEDICLE SCREW FIXATION 3 LEVEL: SHX6143

## 2022-11-27 LAB — GLUCOSE, CAPILLARY
Glucose-Capillary: 132 mg/dL — ABNORMAL HIGH (ref 70–99)
Glucose-Capillary: 167 mg/dL — ABNORMAL HIGH (ref 70–99)
Glucose-Capillary: 208 mg/dL — ABNORMAL HIGH (ref 70–99)
Glucose-Capillary: 206 mg/dL — ABNORMAL HIGH (ref 70–99)

## 2022-11-27 SURGERY — TRANSFORAMINAL LUMBAR INTERBODY FUSION (TLIF) WITH PEDICLE SCREW FIXATION 3 LEVEL
Anesthesia: General

## 2022-11-27 MED ORDER — THROMBIN 5000 UNITS EX SOLR
CUTANEOUS | Status: AC
  Filled 2022-11-27: qty 5000

## 2022-11-27 MED ORDER — LACTATED RINGERS IV SOLN: INTRAVENOUS | Status: DC | PRN

## 2022-11-27 MED ORDER — OXYCODONE HCL 5 MG PO TABS
ORAL_TABLET | ORAL | Status: AC
  Filled 2022-11-27: qty 1

## 2022-11-27 MED ORDER — THROMBIN 20000 UNITS EX SOLR
CUTANEOUS | Status: AC
  Filled 2022-11-27: qty 20000

## 2022-11-27 MED ORDER — METHOCARBAMOL 1000 MG/10ML IJ SOLN: 500.0000 mg | Freq: Four times a day (QID) | INTRAVENOUS | Status: DC | PRN

## 2022-11-27 MED ORDER — ONDANSETRON HCL 4 MG/2ML IJ SOLN: 4.0000 mg | Freq: Once | INTRAMUSCULAR | Status: DC | PRN

## 2022-11-27 MED ORDER — CEFAZOLIN SODIUM 1 G IJ SOLR
INTRAMUSCULAR | Status: AC
  Filled 2022-11-27: qty 20

## 2022-11-27 MED ORDER — ALBUMIN HUMAN 5 % IV SOLN: INTRAVENOUS | Status: DC | PRN

## 2022-11-27 MED ORDER — PHENOL 1.4 % MT LIQD: 1.0000 | OROMUCOSAL | Status: DC | PRN

## 2022-11-27 MED ORDER — ORAL CARE MOUTH RINSE: 15.0000 mL | Freq: Once | OROMUCOSAL | Status: AC

## 2022-11-27 MED ORDER — CEFAZOLIN SODIUM-DEXTROSE 2-4 GM/100ML-% IV SOLN
2.0000 g | INTRAVENOUS | Status: AC
  Administered 2022-11-27 (×2): 2 g via INTRAVENOUS
  Filled 2022-11-27: qty 100

## 2022-11-27 MED ORDER — INSULIN ASPART 100 UNIT/ML IJ SOLN
0.0000 [IU] | Freq: Three times a day (TID) | INTRAMUSCULAR | Status: DC
  Administered 2022-11-27: 5 [IU] via SUBCUTANEOUS
  Administered 2022-11-28: 3 [IU] via SUBCUTANEOUS
  Administered 2022-11-28: 2 [IU] via SUBCUTANEOUS
  Administered 2022-11-28 – 2022-11-29 (×3): 3 [IU] via SUBCUTANEOUS

## 2022-11-27 MED ORDER — MENTHOL 3 MG MT LOZG: 1.0000 | LOZENGE | OROMUCOSAL | Status: DC | PRN

## 2022-11-27 MED ORDER — KETOROLAC TROMETHAMINE 15 MG/ML IJ SOLN
7.5000 mg | Freq: Four times a day (QID) | INTRAMUSCULAR | Status: AC
  Administered 2022-11-27 – 2022-11-28 (×4): 7.5 mg via INTRAVENOUS
  Filled 2022-11-27 (×4): qty 1

## 2022-11-27 MED ORDER — ONDANSETRON HCL 4 MG PO TABS: 4.0000 mg | ORAL_TABLET | Freq: Four times a day (QID) | ORAL | Status: DC | PRN

## 2022-11-27 MED ORDER — PHENYLEPHRINE HCL-NACL 20-0.9 MG/250ML-% IV SOLN
INTRAVENOUS | Status: DC | PRN
  Administered 2022-11-27: 20 ug/min via INTRAVENOUS

## 2022-11-27 MED ORDER — METHOCARBAMOL 500 MG PO TABS
500.0000 mg | ORAL_TABLET | Freq: Four times a day (QID) | ORAL | Status: DC | PRN
  Administered 2022-11-27 – 2022-11-29 (×3): 500 mg via ORAL
  Filled 2022-11-27 (×3): qty 1

## 2022-11-27 MED ORDER — ROSUVASTATIN CALCIUM 5 MG PO TABS
5.0000 mg | ORAL_TABLET | Freq: Every day | ORAL | Status: DC
  Administered 2022-11-27 – 2022-11-28 (×2): 5 mg via ORAL
  Filled 2022-11-27 (×2): qty 1

## 2022-11-27 MED ORDER — THROMBIN 20000 UNITS EX SOLR
CUTANEOUS | Status: DC | PRN
  Administered 2022-11-27: 20 mL via TOPICAL

## 2022-11-27 MED ORDER — VANCOMYCIN HCL 1000 MG IV SOLR
INTRAVENOUS | Status: DC | PRN
  Administered 2022-11-27: 1000 mg

## 2022-11-27 MED ORDER — ONDANSETRON HCL 4 MG/2ML IJ SOLN
INTRAMUSCULAR | Status: AC
  Filled 2022-11-27: qty 2

## 2022-11-27 MED ORDER — OXYCODONE HCL 5 MG PO TABS
5.0000 mg | ORAL_TABLET | Freq: Once | ORAL | Status: AC | PRN
  Administered 2022-11-27: 5 mg via ORAL

## 2022-11-27 MED ORDER — CEFAZOLIN SODIUM-DEXTROSE 2-4 GM/100ML-% IV SOLN
2.0000 g | Freq: Three times a day (TID) | INTRAVENOUS | Status: AC
  Administered 2022-11-27 – 2022-11-28 (×2): 2 g via INTRAVENOUS
  Filled 2022-11-27 (×2): qty 100

## 2022-11-27 MED ORDER — CHLORHEXIDINE GLUCONATE CLOTH 2 % EX PADS
6.0000 | MEDICATED_PAD | Freq: Once | CUTANEOUS | Status: AC
  Administered 2022-11-27: 6 via TOPICAL

## 2022-11-27 MED ORDER — HYDROMORPHONE HCL 1 MG/ML IJ SOLN
INTRAMUSCULAR | Status: DC | PRN
  Administered 2022-11-27: .5 mg via INTRAVENOUS

## 2022-11-27 MED ORDER — CHLORHEXIDINE GLUCONATE 0.12 % MT SOLN
OROMUCOSAL | Status: AC
  Administered 2022-11-27: 15 mL via OROMUCOSAL
  Filled 2022-11-27: qty 15

## 2022-11-27 MED ORDER — ONDANSETRON HCL 4 MG/2ML IJ SOLN: 4.0000 mg | Freq: Four times a day (QID) | INTRAMUSCULAR | Status: DC | PRN

## 2022-11-27 MED ORDER — SODIUM CHLORIDE 0.9 % IV SOLN
250.0000 mL | INTRAVENOUS | Status: DC
  Administered 2022-11-27: 250 mL via INTRAVENOUS

## 2022-11-27 MED ORDER — 0.9 % SODIUM CHLORIDE (POUR BTL) OPTIME
TOPICAL | Status: DC | PRN
  Administered 2022-11-27: 1000 mL

## 2022-11-27 MED ORDER — CHLORHEXIDINE GLUCONATE 0.12 % MT SOLN: 15.0000 mL | Freq: Once | OROMUCOSAL | Status: AC

## 2022-11-27 MED ORDER — BUPIVACAINE LIPOSOME 1.3 % IJ SUSP
INTRAMUSCULAR | Status: AC
  Filled 2022-11-27: qty 20

## 2022-11-27 MED ORDER — PROPOFOL 10 MG/ML IV BOLUS
INTRAVENOUS | Status: DC | PRN
  Administered 2022-11-27: 130 mg via INTRAVENOUS

## 2022-11-27 MED ORDER — ACETAMINOPHEN 500 MG PO TABS
1000.0000 mg | ORAL_TABLET | Freq: Once | ORAL | Status: AC
  Administered 2022-11-27: 1000 mg via ORAL
  Filled 2022-11-27: qty 2

## 2022-11-27 MED ORDER — FENTANYL CITRATE (PF) 250 MCG/5ML IJ SOLN
INTRAMUSCULAR | Status: AC
  Filled 2022-11-27: qty 5

## 2022-11-27 MED ORDER — BUPIVACAINE LIPOSOME 1.3 % IJ SUSP
INTRAMUSCULAR | Status: DC | PRN
  Administered 2022-11-27: 20 mL

## 2022-11-27 MED ORDER — MONTELUKAST SODIUM 10 MG PO TABS
10.0000 mg | ORAL_TABLET | Freq: Every day | ORAL | Status: DC
  Administered 2022-11-27 – 2022-11-28 (×2): 10 mg via ORAL
  Filled 2022-11-27 (×2): qty 1

## 2022-11-27 MED ORDER — LIDOCAINE-EPINEPHRINE 1 %-1:100000 IJ SOLN
INTRAMUSCULAR | Status: AC
  Filled 2022-11-27: qty 1

## 2022-11-27 MED ORDER — ROCURONIUM BROMIDE 10 MG/ML (PF) SYRINGE
PREFILLED_SYRINGE | INTRAVENOUS | Status: DC | PRN
  Administered 2022-11-27: 50 mg via INTRAVENOUS
  Administered 2022-11-27: 30 mg via INTRAVENOUS
  Administered 2022-11-27 (×2): 20 mg via INTRAVENOUS

## 2022-11-27 MED ORDER — VANCOMYCIN HCL 1000 MG IV SOLR
INTRAVENOUS | Status: AC
  Filled 2022-11-27: qty 20

## 2022-11-27 MED ORDER — ACETAMINOPHEN 325 MG PO TABS
650.0000 mg | ORAL_TABLET | ORAL | Status: DC | PRN
  Administered 2022-11-28 – 2022-11-29 (×5): 650 mg via ORAL
  Filled 2022-11-27 (×5): qty 2

## 2022-11-27 MED ORDER — FENTANYL CITRATE (PF) 100 MCG/2ML IJ SOLN
INTRAMUSCULAR | Status: AC
  Filled 2022-11-27: qty 2

## 2022-11-27 MED ORDER — FENTANYL CITRATE (PF) 100 MCG/2ML IJ SOLN
25.0000 ug | INTRAMUSCULAR | Status: DC | PRN
  Administered 2022-11-27: 50 ug via INTRAVENOUS

## 2022-11-27 MED ORDER — ESTRADIOL 0.5 MG PO TABS
1.0000 mg | ORAL_TABLET | Freq: Every day | ORAL | Status: DC
  Administered 2022-11-27: 1 mg via ORAL
  Filled 2022-11-27 (×3): qty 2

## 2022-11-27 MED ORDER — AMLODIPINE BESYLATE 5 MG PO TABS
5.0000 mg | ORAL_TABLET | Freq: Every morning | ORAL | Status: DC
  Administered 2022-11-28 – 2022-11-29 (×2): 5 mg via ORAL
  Filled 2022-11-27 (×2): qty 1

## 2022-11-27 MED ORDER — INSULIN ASPART 100 UNIT/ML IJ SOLN: 0.0000 [IU] | INTRAMUSCULAR | Status: DC | PRN

## 2022-11-27 MED ORDER — DOCUSATE SODIUM 100 MG PO CAPS
100.0000 mg | ORAL_CAPSULE | Freq: Two times a day (BID) | ORAL | Status: DC
  Administered 2022-11-27 – 2022-11-29 (×4): 100 mg via ORAL
  Filled 2022-11-27 (×4): qty 1

## 2022-11-27 MED ORDER — BUPIVACAINE HCL (PF) 0.5 % IJ SOLN
INTRAMUSCULAR | Status: DC | PRN
  Administered 2022-11-27: 5 mL

## 2022-11-27 MED ORDER — LIDOCAINE 2% (20 MG/ML) 5 ML SYRINGE
INTRAMUSCULAR | Status: DC | PRN
  Administered 2022-11-27: 60 mg via INTRAVENOUS

## 2022-11-27 MED ORDER — SODIUM CHLORIDE 0.9% FLUSH: 3.0000 mL | INTRAVENOUS | Status: DC | PRN

## 2022-11-27 MED ORDER — LEVOTHYROXINE SODIUM 25 MCG PO TABS
137.0000 ug | ORAL_TABLET | Freq: Every day | ORAL | Status: DC
  Administered 2022-11-28 – 2022-11-29 (×2): 137 ug via ORAL
  Filled 2022-11-27 (×2): qty 1

## 2022-11-27 MED ORDER — ONDANSETRON HCL 4 MG/2ML IJ SOLN
INTRAMUSCULAR | Status: DC | PRN
  Administered 2022-11-27: 4 mg via INTRAVENOUS

## 2022-11-27 MED ORDER — HYDROMORPHONE HCL 1 MG/ML IJ SOLN
INTRAMUSCULAR | Status: AC
  Filled 2022-11-27: qty 1

## 2022-11-27 MED ORDER — LIDOCAINE-EPINEPHRINE 1 %-1:100000 IJ SOLN
INTRAMUSCULAR | Status: DC | PRN
  Administered 2022-11-27: 5 mL

## 2022-11-27 MED ORDER — OXYCODONE HCL 5 MG PO TABS
5.0000 mg | ORAL_TABLET | ORAL | Status: DC | PRN
  Administered 2022-11-28 – 2022-11-29 (×4): 5 mg via ORAL
  Filled 2022-11-27 (×4): qty 1

## 2022-11-27 MED ORDER — OXYCODONE HCL 5 MG PO TABS
10.0000 mg | ORAL_TABLET | ORAL | Status: DC | PRN
  Administered 2022-11-28 – 2022-11-29 (×3): 10 mg via ORAL
  Filled 2022-11-27 (×3): qty 2

## 2022-11-27 MED ORDER — POLYETHYLENE GLYCOL 3350 17 G PO PACK
17.0000 g | PACK | Freq: Every day | ORAL | Status: DC | PRN
  Administered 2022-11-27 – 2022-11-29 (×3): 17 g via ORAL
  Filled 2022-11-27 (×3): qty 1

## 2022-11-27 MED ORDER — ROCURONIUM BROMIDE 10 MG/ML (PF) SYRINGE
PREFILLED_SYRINGE | INTRAVENOUS | Status: AC
  Filled 2022-11-27: qty 10

## 2022-11-27 MED ORDER — FENTANYL CITRATE (PF) 250 MCG/5ML IJ SOLN
INTRAMUSCULAR | Status: DC | PRN
  Administered 2022-11-27 (×5): 50 ug via INTRAVENOUS

## 2022-11-27 MED ORDER — HYDROMORPHONE HCL 1 MG/ML IJ SOLN
INTRAMUSCULAR | Status: AC
  Filled 2022-11-27: qty 0.5

## 2022-11-27 MED ORDER — HYDROMORPHONE HCL 1 MG/ML IJ SOLN
0.5000 mg | INTRAMUSCULAR | Status: DC | PRN
  Administered 2022-11-27 (×2): 0.5 mg via INTRAVENOUS
  Filled 2022-11-27: qty 0.5

## 2022-11-27 MED ORDER — SODIUM CHLORIDE 0.9% FLUSH
3.0000 mL | Freq: Two times a day (BID) | INTRAVENOUS | Status: DC
  Administered 2022-11-27 – 2022-11-29 (×3): 3 mL via INTRAVENOUS

## 2022-11-27 MED ORDER — OXYCODONE HCL 5 MG/5ML PO SOLN: 5.0000 mg | Freq: Once | ORAL | Status: AC | PRN

## 2022-11-27 MED ORDER — THROMBIN 5000 UNITS EX SOLR
OROMUCOSAL | Status: DC | PRN
  Administered 2022-11-27 (×2): 5 mL via TOPICAL

## 2022-11-27 MED ORDER — SUGAMMADEX SODIUM 200 MG/2ML IV SOLN
INTRAVENOUS | Status: DC | PRN
  Administered 2022-11-27: 200 mg via INTRAVENOUS

## 2022-11-27 MED ORDER — LIDOCAINE 2% (20 MG/ML) 5 ML SYRINGE
INTRAMUSCULAR | Status: AC
  Filled 2022-11-27: qty 5

## 2022-11-27 MED ORDER — DEXAMETHASONE SODIUM PHOSPHATE 10 MG/ML IJ SOLN
INTRAMUSCULAR | Status: DC | PRN
  Administered 2022-11-27: 5 mg via INTRAVENOUS

## 2022-11-27 MED ORDER — ACETAMINOPHEN 650 MG RE SUPP: 650.0000 mg | RECTAL | Status: DC | PRN

## 2022-11-27 MED ORDER — PROPOFOL 10 MG/ML IV BOLUS
INTRAVENOUS | Status: AC
  Filled 2022-11-27: qty 20

## 2022-11-27 MED ORDER — LACTATED RINGERS IV SOLN: INTRAVENOUS | Status: DC

## 2022-11-27 MED ORDER — DEXAMETHASONE SODIUM PHOSPHATE 10 MG/ML IJ SOLN
INTRAMUSCULAR | Status: AC
  Filled 2022-11-27: qty 1

## 2022-11-27 MED ORDER — LOSARTAN POTASSIUM 50 MG PO TABS
100.0000 mg | ORAL_TABLET | Freq: Every morning | ORAL | Status: DC
  Administered 2022-11-28 – 2022-11-29 (×2): 100 mg via ORAL
  Filled 2022-11-27 (×2): qty 2

## 2022-11-27 MED ORDER — BUPIVACAINE HCL (PF) 0.5 % IJ SOLN
INTRAMUSCULAR | Status: AC
  Filled 2022-11-27: qty 30

## 2022-11-27 SURGICAL SUPPLY — 97 items
BAG BANDED W/RUBBER/TAPE 36X54 (MISCELLANEOUS) ×2 IMPLANT
BAG COUNTER SPONGE SURGICOUNT (BAG) ×1 IMPLANT
BAND RUBBER #18 3X1/16 STRL (MISCELLANEOUS) IMPLANT
BASKET BONE COLLECTION (BASKET) IMPLANT
BLADE BONE MILL MEDIUM (MISCELLANEOUS) IMPLANT
BUR 14 MATCH 3 (BUR) IMPLANT
BUR CARBIDE MATCH 3.0 (BURR) ×1 IMPLANT
BUR MR8 14 BALL 5 (BUR) IMPLANT
BURR 14 MATCH 3 (BUR) ×1
BURR MR8 14 BALL 5 (BUR)
CAGE CONVEX CASCADIA 8.5X22X7 (Cage) IMPLANT
CAGE POR CASCADIA 8.5X28X9 (Cage) IMPLANT
CASCADIA AN CONVEX 8.5X28X11 (Cage) ×1 IMPLANT
CNTNR URN SCR LID CUP LEK RST (MISCELLANEOUS) ×1 IMPLANT
CONT SPEC 4OZ STRL OR WHT (MISCELLANEOUS) ×2
COVER BACK TABLE 60X90IN (DRAPES) ×1 IMPLANT
COVER MAYO STAND STRL (DRAPES) ×1 IMPLANT
COVERAGE SUPPORT O-ARM STEALTH (MISCELLANEOUS) ×1 IMPLANT
DERMABOND ADVANCED .7 DNX12 (GAUZE/BANDAGES/DRESSINGS) ×1 IMPLANT
DRAIN JACKSON RD 7FR 3/32 (WOUND CARE) IMPLANT
DRAPE LAPAROTOMY 100X72X124 (DRAPES) ×1 IMPLANT
DRAPE MICROSCOPE SLANT 54X150 (MISCELLANEOUS) IMPLANT
DRAPE SHEET LG 3/4 BI-LAMINATE (DRAPES) ×4 IMPLANT
DRAPE SURG 17X23 STRL (DRAPES) ×1 IMPLANT
DRSG OPSITE 4X5.5 SM (GAUZE/BANDAGES/DRESSINGS) ×1 IMPLANT
DRSG OPSITE POSTOP 4X8 (GAUZE/BANDAGES/DRESSINGS) IMPLANT
DRSG TELFA 3X8 NADH STRL (GAUZE/BANDAGES/DRESSINGS) IMPLANT
DURAPREP 26ML APPLICATOR (WOUND CARE) ×1 IMPLANT
ELECT BLADE INSULATED 4IN (ELECTROSURGICAL) ×1
ELECT BLADE INSULATED 6.5IN (ELECTROSURGICAL) ×1
ELECT REM PT RETURN 9FT ADLT (ELECTROSURGICAL) ×1
ELECTRODE BLADE INSULATED 4IN (ELECTROSURGICAL) ×1 IMPLANT
ELECTRODE BLDE INSULATED 6.5IN (ELECTROSURGICAL) IMPLANT
ELECTRODE REM PT RTRN 9FT ADLT (ELECTROSURGICAL) ×1 IMPLANT
EVACUATOR 1/8 PVC DRAIN (DRAIN) IMPLANT
FEE COVERAGE SUPPORT O-ARM (MISCELLANEOUS) ×1 IMPLANT
GAUZE 4X4 16PLY ~~LOC~~+RFID DBL (SPONGE) IMPLANT
GAUZE PAD ABD 8X10 STRL (GAUZE/BANDAGES/DRESSINGS) ×1 IMPLANT
GAUZE SPONGE 4X4 12PLY STRL (GAUZE/BANDAGES/DRESSINGS) ×1 IMPLANT
GLOVE BIOGEL PI IND STRL 7.5 (GLOVE) IMPLANT
GLOVE BIOGEL PI IND STRL 8 (GLOVE) ×2 IMPLANT
GLOVE ECLIPSE 7.0 STRL STRAW (GLOVE) IMPLANT
GLOVE ECLIPSE 8.0 STRL XLNG CF (GLOVE) ×2 IMPLANT
GLOVE SURG ENC MOIS LTX SZ8 (GLOVE) ×1 IMPLANT
GLOVE SURG SS PI 6.5 STRL IVOR (GLOVE) IMPLANT
GLOVE SURG UNDER POLY LF SZ8.5 (GLOVE) ×1 IMPLANT
GOWN STRL REUS W/ TWL LRG LVL3 (GOWN DISPOSABLE) IMPLANT
GOWN STRL REUS W/ TWL XL LVL3 (GOWN DISPOSABLE) ×2 IMPLANT
GOWN STRL REUS W/TWL 2XL LVL3 (GOWN DISPOSABLE) IMPLANT
GOWN STRL REUS W/TWL LRG LVL3 (GOWN DISPOSABLE)
GOWN STRL REUS W/TWL XL LVL3 (GOWN DISPOSABLE) ×4
HEMOSTAT POWDER SURGIFOAM 1G (HEMOSTASIS) IMPLANT
INTERBODY CSCD AN CVX8.5X28X11 (Cage) IMPLANT
KIT BASIN OR (CUSTOM PROCEDURE TRAY) ×1 IMPLANT
KIT INFUSE SMALL (Orthopedic Implant) IMPLANT
KIT POSITION SURG JACKSON T1 (MISCELLANEOUS) ×1 IMPLANT
KIT TURNOVER KIT B (KITS) ×1 IMPLANT
MARKER SKIN DUAL TIP RULER LAB (MISCELLANEOUS) ×1 IMPLANT
MARKER SPHERE PSV REFLC NDI (MISCELLANEOUS) ×5 IMPLANT
MILL BONE PREP (MISCELLANEOUS) IMPLANT
NDL HYPO 21X1.5 ECLIPSE (NEEDLE) ×1 IMPLANT
NDL HYPO 21X1.5 SAFETY (NEEDLE) IMPLANT
NDL HYPO 25X1 1.5 SAFETY (NEEDLE) ×1 IMPLANT
NDL SPNL 22GX3.5 QUINCKE BK (NEEDLE) IMPLANT
NEEDLE HYPO 21X1.5 ECLIPSE (NEEDLE) ×1 IMPLANT
NEEDLE HYPO 21X1.5 SAFETY (NEEDLE) ×1 IMPLANT
NEEDLE HYPO 25X1 1.5 SAFETY (NEEDLE) ×1 IMPLANT
NEEDLE SPNL 22GX3.5 QUINCKE BK (NEEDLE) ×1 IMPLANT
NS IRRIG 1000ML POUR BTL (IV SOLUTION) ×1 IMPLANT
PACK LAMINECTOMY NEURO (CUSTOM PROCEDURE TRAY) ×1 IMPLANT
PAD ARMBOARD 7.5X6 YLW CONV (MISCELLANEOUS) ×3 IMPLANT
PATTIES SURGICAL .5 X.5 (GAUZE/BANDAGES/DRESSINGS) IMPLANT
PATTIES SURGICAL .5 X1 (DISPOSABLE) IMPLANT
PATTIES SURGICAL 1X1 (DISPOSABLE) IMPLANT
PUTTY BONE 100 VESUVIUS 2.5CC (Putty) IMPLANT
ROD CURVED 80MM (Rod) ×2 IMPLANT
ROD SPNL CNTR 80X5.5XNS TI (Rod) IMPLANT
SCREW POLYAXIAL 7.5X45 (Screw) IMPLANT
SCREW POLYAXIAL 7.5X50 (Screw) IMPLANT
SET SCREW (Screw) ×8 IMPLANT
SET SCREW VRST (Screw) IMPLANT
SPIKE FLUID TRANSFER (MISCELLANEOUS) ×1 IMPLANT
SPONGE SURGIFOAM ABS GEL 100 (HEMOSTASIS) ×1 IMPLANT
SPONGE T-LAP 4X18 ~~LOC~~+RFID (SPONGE) IMPLANT
STAPLER VISISTAT 35W (STAPLE) ×1 IMPLANT
SUT VIC AB 0 CT1 18XCR BRD8 (SUTURE) IMPLANT
SUT VIC AB 0 CT1 27 (SUTURE) ×1
SUT VIC AB 0 CT1 27XBRD ANBCTR (SUTURE) ×1 IMPLANT
SUT VIC AB 0 CT1 8-18 (SUTURE) ×2
SUT VIC AB 2-0 CP2 18 (SUTURE) ×1 IMPLANT
SUT VIC AB 3-0 SH 8-18 (SUTURE) IMPLANT
SYR 20ML LL LF (SYRINGE) ×1 IMPLANT
TOWEL GREEN STERILE (TOWEL DISPOSABLE) IMPLANT
TOWEL GREEN STERILE FF (TOWEL DISPOSABLE) IMPLANT
TRAY FOLEY MTR SLVR 14FR STAT (SET/KITS/TRAYS/PACK) IMPLANT
TRAY FOLEY MTR SLVR 16FR STAT (SET/KITS/TRAYS/PACK) ×1 IMPLANT
WATER STERILE IRR 1000ML POUR (IV SOLUTION) ×1 IMPLANT

## 2022-11-27 NOTE — Op Note (Signed)
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, stenosis, L4-5 degenerative stenosis, grade 1 spondylolisthesis, mobile; L5-S1 degenerative spondylolisthesis-mobile, stenosis with claudication and radiculopathy  POSTOP DIAGNOSIS: Same  PROCEDURE: Open lumbar transforaminal lumbar interbody fusion and posterior lateral instrumentation and arthrodesis L3-4, L4-5, L5-S1 Open lumbar decompression via bilateral laminectomy and right facetectomy L3-4, L4-5, L5-S1 for decompression of neural elements Bilateral segmental pedicle screw instrumentation, L3, L4, L5, S1: K2 M [place] L3: 7.5 x 50 mm bilateral, L4: 7.5 x 45 mm bilaterally, L5: 7.5 x 45 mm bilaterally, S1 7.5 x 45 mm bilaterally Placement of anterior biomechanical device L3-4: K2 M [place] 28 x 11 mm interbody, L4-5: 9 mm titanium interbody, L5-S1: 7 mm titanium interbody Intraoperative use of stereotaxy and O-arm for placement of pedicle screws Intraoperative use of autograft, same incision Intraoperative use of allograft Intraoperative use of bmp Intraoperative use of microscope for microdissection  SURGEON: Dr. Pieter Partridge C. Jakeisha Stricker, DO  ASSISTANT: Dr. Consuella Lose, MD  ANESTHESIA: General Endotracheal  EBL: 500 cc  SPECIMENS: None  DRAINS: Medium Hemovac  COMPLICATIONS: None  CONDITION: Hemodynamically stable  HISTORY: Michaela Morris is a 77 y.o. female with a longstanding history of chronic low back pain bilateral lower extremity radiculopathy and neurogenic claudication.  She failed conservative measures for pain control including steroid injections.  MRI revealed severe stenosis due to multifactorial facet and ligamentum hypertrophy at L3-4 with degenerative disc disease and broad-based disc bulging, L4-5 had degenerative grade 1 spondylolisthesis with severe stenosis and severe facet arthropathy, L5-S1 had degenerative grade 1  spondylolisthesis with severe stenosis and severe facet arthropathy.  X-rays revealed mobile spondylolisthesis at L4-5 and L5-S1.  Given her symptomatology, and her failure of conservative measures I offered her surgical intervention in the form of an L3-S1 decompression, TLIF, posterior lateral instrumentation and fusion.  All risks, benefits and expected outcomes as well as alternatives were discussed and agreed upon.  Informed consent was obtained and witnessed.  PROCEDURE IN DETAIL: The patient was brought to the operating room. After induction of general anesthesia, the patient was positioned on the operative table in the prone position. All pressure points were meticulously padded. Skin incision was then marked out and prepped and draped in the usual sterile fashion. Physician driven timeout was performed.  Using 10 blade, midline incision was performed down to the dorsal fascia over the L3-S1 spinous processes.  Using Bovie electrocautery, subperiosteal dissection was performed bilaterally to expose the L3 lamina, L3-4 facets, transverse processes and similarly at L4-5, L5-S1.  Lateral fluoroscopy confirmed the appropriate level.  Deep retractors were placed in the wound.  Stereotactic reference frame was attached via spinous process at L2.  The field was sterilely draped in the intraoperative CT navigation scan was obtained and noted to be excellently accurate.  Stereotactic spinal navigation was utilized throughout the procedure for planning and placement of pedicle screw trajectories.  Using the high-speed drill I then created a pilot hole bilaterally to access the L3 pedicles.  Then using a 6.5 mm tap, this was tapped to a depth of approximately 40 mm.  Using a ball-tipped probe, there were bony borders in all directions as well as a bony floor.  The above listed pedicle screws were placed with appropriate bony purchase.  This was repeated bilaterally at L4, L5, S1.  L5-S1 decompression and TLIF:  Attention was then turned to our decompression.  The microscope was sterilely draped and brought into the field  for the remainder the procedure.  Using the high-speed drill, bilateral laminectomy was performed down to the ligamentum flavum at L5-S1, this was carried laterally to disarticulate the right inferior articulating process.  Meanwhile autograft was collected for later use.  The superior articulating process was removed with a high-speed drill on the right down to the ligamentum attachment.  The ligamentum flavum was then gently elevated from the epidural space and removed with Kerrison rongeurs.  The exiting and traversing nerve root were identified and protected.  Using Kerrison rongeurs they were adequately decompressed.  Camden's triangle was identified, epidural hemostasis was achieved with bipolar cautery.  Annulotomy was performed with an 15 blade.  Using a series of disc space shavers and curettes, radical discectomy was performed down to subchondral bleeding bone on both endplates.  The annulotomy was widened with Kerrison rongeurs.  The above listed interbody device was selected via lateral fluoroscopy.  A mixture of autograft, BMP and allograft was placed anterior lateral to the interbody device.  The interbody device was then placed under live lateral fluoroscopy while gently retracting the traversing nerve root. Further autograft and allograft was placed lateral to the interbody device.  Epidural hemostasis was achieved with bipolar cautery.  Using ball-tipped probe, the common dural tube, traversing and exiting nerve root were noted to be adequately decompressed and pulsatile.  Epidural hemostasis was achieved with Surgifoam.  L4-5 decompression and TLIF: Attention was then turned to our decompression. Using the high-speed drill, bilateral laminectomy was performed down to the ligamentum flavum at L4-5, this was carried laterally to disarticulate the right inferior articulating process.   Meanwhile autograft was collected for later use.  The superior articulating process was removed with a high-speed drill on the right down to the ligamentum attachment.  The ligamentum flavum was then gently elevated from the epidural space and removed with Kerrison rongeurs.  The exiting and traversing nerve root were identified and protected.  Using Kerrison rongeurs they were adequately decompressed.  Camden's triangle was identified, epidural hemostasis was achieved with bipolar cautery.  Annulotomy was performed with an 15 blade.  Using a series of disc space shavers and curettes, radical discectomy was performed down to subchondral bleeding bone on both endplates.  The annulotomy was widened with Kerrison rongeurs.  The above listed interbody device was selected via lateral fluoroscopy.  A mixture of autograft, BMP and allograft was placed anterior lateral to the interbody device.  The interbody device was then placed under live lateral fluoroscopy while gently retracting the traversing nerve root. Further autograft and allograft was placed lateral to the interbody device.  Epidural hemostasis was achieved with bipolar cautery.  Using ball-tipped probe, the common dural tube, traversing and exiting nerve root were noted to be adequately decompressed and pulsatile.  Epidural hemostasis was achieved with Surgifoam.  L3-4 decompression and TLIF: Attention was then turned to our decompression.  The microscope was sterilely draped and brought into the field for the remainder the procedure.  Using the high-speed drill, bilateral laminectomy was performed down to the ligamentum flavum at L3-4, this was carried laterally to disarticulate the right inferior articulating process.  Meanwhile autograft was collected for later use.  The superior articulating process was removed with a high-speed drill on the right down to the ligamentum attachment.  The ligamentum flavum was then gently elevated from the epidural space  and removed with Kerrison rongeurs.  The exiting and traversing nerve root were identified and protected.  Using Kerrison rongeurs they were adequately decompressed.  Camden's triangle was identified, epidural hemostasis was achieved with bipolar cautery.  Annulotomy was performed with an 15 blade.  Using a series of disc space shavers and curettes, radical discectomy was performed down to subchondral bleeding bone on both endplates.  The annulotomy was widened with Kerrison rongeurs.  The above listed interbody device was selected via lateral fluoroscopy.  A mixture of autograft, BMP and allograft was placed anterior lateral to the interbody device.  The interbody device was then placed under live lateral fluoroscopy while gently retracting the traversing nerve root. Further autograft and allograft was placed lateral to the interbody device.  Epidural hemostasis was achieved with bipolar cautery.  Using ball-tipped probe, the common dural tube, traversing and exiting nerve root were noted to be adequately decompressed and pulsatile.  Epidural hemostasis was achieved with Surgifoam.  Lateral fluoroscopy confirmed appropriate placement of all interbody devices and instrumentation.  We then proceeded to complete the posterior lateral fusion by decorticating the remaining joints on the left side and the transverse processes at L3, L4, L5, S1.  Remaining autograft and allograft was mixed and placed in the lateral gutters.  I then measured, contoured and selected rods for the bilateral pedicle screws.  Rods were contoured appropriately and placed in the tulips, setscrews placed and final tightened to the manufacturer's recommendation.  Deep retractors were taken out of the wound.  Soft tissue hemostasis was achieved with bipolar cautery.  A Hemovac was tunneled laterally and placed in the epidural space.  The wound was noted to be excellently hemostatic.  Long-acting local anesthetic was injected into the soft tissues.   We then closed the wound in layers, with 0 Vicryl sutures for muscle and fascia.  2-0 and 3-0 Vicryl sutures for dermis.  Skin was closed with skin glue.  Sterile dressing applied.  At the end of the case all sponge, needle, and instrument counts were correct. The patient was then transferred to the stretcher, extubated, and taken to the post-anesthesia care unit in stable hemodynamic condition.

## 2022-11-27 NOTE — Anesthesia Postprocedure Evaluation (Signed)
Anesthesia Post Note  Patient: Michaela Morris  Procedure(s) Performed: OPEN LUMBAR DECOMPRESSION TRANSFORAMINAL LUMBAR INTERBODY FUSION, POSTEROLATERAL ARTHRODESIS, LUMBAR THREE-FOUR, LUMBAR FOUR-FIVE, LUMBAR FIVE-SACRAL ONE Application of O-Arm     Patient location during evaluation: PACU Anesthesia Type: General Level of consciousness: awake and alert Pain management: pain level controlled Vital Signs Assessment: post-procedure vital signs reviewed and stable Respiratory status: spontaneous breathing, nonlabored ventilation and respiratory function stable Cardiovascular status: stable and blood pressure returned to baseline Anesthetic complications: no   No notable events documented.  Last Vitals:  Vitals:   11/27/22 1715 11/27/22 1730  BP: 101/85 128/73  Pulse: 76 76  Resp: 14 16  Temp:    SpO2: 92% 96%    Last Pain:  Vitals:   11/27/22 1712  TempSrc:   PainSc: [place]

## 2022-11-27 NOTE — Transfer of Care (Signed)
Immediate Anesthesia Transfer of Care Note  Patient: Michaela Morris  Procedure(s) Performed: OPEN LUMBAR DECOMPRESSION TRANSFORAMINAL LUMBAR INTERBODY FUSION, POSTEROLATERAL ARTHRODESIS, LUMBAR THREE-FOUR, LUMBAR FOUR-FIVE, LUMBAR FIVE-SACRAL ONE Application of O-Arm  Patient Location: PACU  Anesthesia Type:General  Level of Consciousness: awake, alert , and oriented  Airway & Oxygen Therapy: Patient Spontanous Breathing and Patient connected to face mask oxygen  Post-op Assessment: Report given to RN, Post -op Vital signs reviewed and stable, and Patient moving all extremities X 4  Post vital signs: Reviewed and stable  Last Vitals:  Vitals Value Taken Time  BP 127/56 11/27/22 1650  Temp    Pulse 83 11/27/22 1653  Resp 21 11/27/22 1653  SpO2 95 % 11/27/22 1653  Vitals shown include unvalidated device data.  Last Pain:  Vitals:   11/27/22 0804  TempSrc:   PainSc: 3          Complications: No notable events documented.

## 2022-11-27 NOTE — H&P (Signed)
Providing Compassionate, Quality Care - Together  NEUROSURGERY HISTORY & PHYSICAL   Michaela Morris is an 77 y.o. female.   Chief Complaint: Low back pain, neurogenic claudication HPI: This is a 77 year old female with a history of worsening low back pain, lower extremity radiculopathy and claudication that is altering her lifestyle.  MRI of the lumbar spine revealed severe degenerative spondylosis at L3-4, with severe stenosis, severe stenosis with spondylolisthesis at L4-5, L5-S1.  She presents today for surgical intervention.  She has failed conservative measures.  She has a history of diabetes, GERD, hypertension, cholesterolemia.  Clearance was obtained, she has held her aspirin x 7 days.  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    Bronchitis    hx of    Bursitis of left hip    Degenerative lumbar disc    Diabetes mellitus without complication (HCC)    GERD (gastroesophageal reflux disease)    Hypercholesteremia    Hypertension    Hypothyroidism    Osteoarthritis    left knee/left hip    Shortness of breath dyspnea    walking distances or climbing stairs   Sleep apnea    uses CPAP   Spondylolisthesis    hx of     Past Surgical History:  Procedure Laterality Date   ABDOMINAL HYSTERECTOMY     06/1988    APPENDECTOMY     arthroscopy of shoulder      right    BREAST SURGERY     breast biopsy / left    colonscopy      removal of polyp    DILATION AND CURETTAGE OF UTERUS     ROTATOR CUFF REPAIR     right    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 Her smoking use included cigarettes. She has a 0.50 pack-year smoking history. She has never used smokeless tobacco. She reports current alcohol use. She reports that she does not use drugs.  Allergies:  Allergies  Allergen Reactions   Bactrim [Sulfamethoxazole-Trimethoprim] Diarrhea and Nausea And Vomiting   Erythromycin Diarrhea and Nausea And Vomiting    Medications Prior to Admission  Medication Sig Dispense Refill   amLODipine (NORVASC) 5 MG tablet Take 5 mg by mouth in the morning.     aspirin EC 81 MG tablet Take 81 mg by mouth at bedtime. Swallow whole.     betamethasone dipropionate 0.05 % cream Apply 1 Application topically every other day. Vaginal area     celecoxib (CELEBREX) 200 MG capsule Take 200 mg by mouth in the morning.     Cholecalciferol (VITAMIN D3) 125 MCG (5000 UT) TABS Take 5,000 Units by mouth every evening.     Coenzyme Q10 (COQ10 PO) Take 200 mg by mouth at bedtime.     Cyanocobalamin (VITAMIN B12 SL) Place  5,000 mcg under the tongue 2 (two) times a week. Sundays & Wednesdays in the morning.     desonide (DESOWEN) 0.05 % cream Apply 1 application  topically daily as needed (irritation).     ELDERBERRY PO Take 3,200 mg by mouth in the morning.     estradiol (ESTRACE) 1 MG tablet Take 1 mg by mouth at bedtime.     losartan (COZAAR) 100 MG tablet Take 100 mg by mouth in the morning.     metFORMIN (GLUCOPHAGE) 1000 MG tablet Take 1,000 mg by mouth 2 (two) times daily.     MILK THISTLE PLUS PO Take 1 capsule by mouth in the morning.     montelukast (SINGULAIR) 10 MG tablet Take 10 mg by mouth at bedtime.     polyethylene glycol (MIRALAX / GLYCOLAX) 17 g packet Take 17 g by mouth daily as needed (constipation.).     rosuvastatin (CRESTOR) 5 MG tablet Take 5 mg by  mouth at bedtime.     SYNTHROID 137 MCG tablet Take 137 mcg by mouth daily before breakfast.     TURMERIC PO Take 2,250 mg by mouth in the morning. Qunol Turmeric Curcumin     fluticasone (FLONASE) 50 MCG/ACT nasal spray Place 2 sprays into both nostrils daily as needed (seasonal allergies.).      No results found for this or any previous visit (from the past 48 hour(s)). No results found.  ROS All pertinent positives and negatives are listed HPI above  Blood pressure (!) 167/67, pulse 79, temperature 98.8 F (37.1 C), temperature source Oral, resp. rate 18, height 5' 2"$  (1.575 m), weight 97.1 kg, SpO2 94 %. Physical Exam  Awake alert Oriented x 3, no acute distress PERRLA Cranial nerves II through XII intact Nonlabored breathing Bilateral upper extremities 5/5 Bilateral lower extremities 4+/5  Assessment/Plan 77 year old female with  L3-S1 degenerative lumbar spondylosis, stenosis, L4-S1 spondylolisthesis  -OR today for L3-S1 decompression, TLIF, posterior lateral instrumentation and fusion.  We have discussed all risks, benefits and expected outcomes in terms of treatment.  She failed conservative measures.  Informed consent was obtained and witnessed.  Thank you for allowing me to participate in this patient's care.  Please do not hesitate to call with questions or concerns.   Elwin Sleight, [facility] Cell: [phone]

## 2022-11-27 NOTE — Anesthesia Procedure Notes (Signed)
Procedure Name: Intubation Date/Time: 11/27/2022 10:46 AM  Performed by: Inda Coke, CRNAPre-anesthesia Checklist: Patient identified, Emergency Drugs available, Suction available, Timeout performed and Patient being monitored Patient Re-evaluated:Patient Re-evaluated prior to induction Oxygen Delivery Method: Circle system utilized Preoxygenation: Pre-oxygenation with 100% oxygen Induction Type: IV induction Ventilation: Mask ventilation without difficulty Laryngoscope Size: Mac and 3 Grade View: Grade I Tube type: Oral Tube size: 7.5 mm Airway Equipment and Method: Stylet Placement Confirmation: ETT inserted through vocal cords under direct vision, positive ETCO2, CO2 detector and breath sounds checked- equal and bilateral Secured at: 22 cm Tube secured with: Tape Dental Injury: Teeth and Oropharynx as per pre-operative assessment

## 2022-11-28 ENCOUNTER — Other Ambulatory Visit: Payer: Self-pay

## 2022-11-28 ENCOUNTER — Encounter (HOSPITAL_COMMUNITY): Payer: Self-pay | Admitting: Neurological Surgery

## 2022-11-28 LAB — CBC
HCT: 28.8 % — ABNORMAL LOW (ref 36.0–46.0)
Hemoglobin: 9.2 g/dL — ABNORMAL LOW (ref 12.0–15.0)
MCH: 29.4 pg (ref 26.0–34.0)
MCHC: 31.9 g/dL (ref 30.0–36.0)
MCV: 92 fL (ref 80.0–100.0)
Platelets: 202 10*3/uL (ref 150–400)
RBC: 3.13 MIL/uL — ABNORMAL LOW (ref 3.87–5.11)
RDW: 14 % (ref 11.5–15.5)
WBC: 11.2 10*3/uL — ABNORMAL HIGH (ref 4.0–10.5)
nRBC: 0 % (ref 0.0–0.2)

## 2022-11-28 LAB — BASIC METABOLIC PANEL
Anion gap: 11 (ref 5–15)
BUN: 9 mg/dL (ref 8–23)
CO2: 24 mmol/L (ref 22–32)
Calcium: 7.9 mg/dL — ABNORMAL LOW (ref 8.9–10.3)
Chloride: 99 mmol/L (ref 98–111)
Creatinine, Ser: 0.68 mg/dL (ref 0.44–1.00)
GFR, Estimated: >60 mL/min (ref >60)
Glucose, Bld: 144 mg/dL — ABNORMAL HIGH (ref 70–99)
Potassium: 3.7 mmol/L (ref 3.5–5.1)
Sodium: 134 mmol/L — ABNORMAL LOW (ref 135–145)

## 2022-11-28 LAB — GLUCOSE, CAPILLARY
Glucose-Capillary: 138 mg/dL — ABNORMAL HIGH (ref 70–99)
Glucose-Capillary: 161 mg/dL — ABNORMAL HIGH (ref 70–99)
Glucose-Capillary: 194 mg/dL — ABNORMAL HIGH (ref 70–99)
Glucose-Capillary: 176 mg/dL — ABNORMAL HIGH (ref 70–99)

## 2022-11-28 MED FILL — Thrombin For Soln 5000 Unit: CUTANEOUS | Qty: 5000 | Status: AC

## 2022-11-28 NOTE — Progress Notes (Signed)
   Providing Compassionate, Quality Care - Together  NEUROSURGERY PROGRESS NOTE   S: No issues overnight. Doing well, notes improvement in legs sensation/pain  O: EXAM:  BP 139/65 (BP Location: Left Arm)   Pulse 79   Temp 98.3 F (36.8 C) (Oral)   Resp 18   Ht 5\' 2"  (1.575 m)   Wt 98.1 kg   SpO2 91%   BMI 39.56 kg/m   Awake, alert, oriented x3 PERRL Speech fluent, appropriate  CNs grossly intact  5/5 BUE/BLE  Wound c/d/I Hmv in place  ASSESSMENT:  77 y.o. female with   L3-S1 spondylosis, stenosis with claudication/radiculopathy  -s/p L3-S1 open TLIF 2/13  PLAN: - pt/ot eval - leave hmv  -postop labs wnl   Thank you for allowing me to participate in this patient's care.  Please do not hesitate to call with questions or concerns.   Elwin Sleight, [facility] Cell: [phone]

## 2022-11-28 NOTE — Evaluation (Signed)
Occupational Therapy Evaluation Patient Details Name: Michaela Morris MRN: [id number] DOB: 1946-07-21 Today's Date: 11/28/2022   History of Present Illness Patient is a 77 yo female presenting to the hospital for L3-S1 fusion. PMH includes: Blood dyscrasia, GERD, HTN, hypothyroidism, and OA   Clinical Impression   Prior to this evaluation, patient independent with ADLs and still driving. Currently, patient presenting with minimal back pain, and minimal need for increased assist with ADLs. Patient min guard for sit<>stand from bed and toilet with OT reviewing precautions and lower body dressing. Patient handed off to PT to complete further ambulation. OT to see one more time tomorrow prior to discharge to educate with regard to lower body dressing and back precautions to ensure functional independence.      Recommendations for follow up therapy are one component of a multi-disciplinary discharge planning process, led by the attending physician.  Recommendations may be updated based on patient status, additional functional criteria and insurance authorization.   Follow Up Recommendations  No OT follow up     Assistance Recommended at Discharge PRN  Patient can return home with the following A little help with walking and/or transfers;A little help with bathing/dressing/bathroom;Assistance with cooking/housework;Assist for transportation    Functional Status Assessment  Patient has had a recent decline in their functional status and demonstrates the ability to make significant improvements in function in a reasonable and predictable amount of time.  Equipment Recommendations  None recommended by OT (patient has DME needed)    Recommendations for Other Services       Precautions / Restrictions Precautions Precautions: Back Precaution Booklet Issued: No Precaution Comments: Able to recite without cues Restrictions Weight Bearing Restrictions: No      Mobility Bed Mobility                General bed mobility comments: Patient sitting EOB upon OT entry, PT educating family on bed mobility    Transfers Overall transfer level: Needs assistance Equipment used: Rolling walker (2 wheels) Transfers: Sit to/from Stand Sit to Stand: Min guard           General transfer comment: min gaurd from EOB and to get off of toilet      Balance Overall balance assessment: Needs assistance Sitting-balance support: Feet supported Sitting balance-Leahy Scale: Good     Standing balance support: Bilateral upper extremity supported Standing balance-Leahy Scale: Fair                             ADL either performed or assessed with clinical judgement   ADL Overall ADL's : Needs assistance/impaired Eating/Feeding: Set up;Sitting   Grooming: Set up;Sitting   Upper Body Bathing: Min guard;Sitting   Lower Body Bathing: Minimal assistance;Sitting/lateral leans;Sit to/from stand   Upper Body Dressing : Min guard;Sitting   Lower Body Dressing: Minimal assistance;Moderate assistance;Sitting/lateral leans;Sit to/from stand   Toilet Transfer: Minimal assistance;Regular Toilet;Grab bars;Rolling walker (2 wheels)   Toileting- Clothing Manipulation and Hygiene: Min guard;Sitting/lateral lean;Sit to/from stand Toileting - Clothing Manipulation Details (indicate cue type and reason): able to wipe from front, has a Metallurgist at home     Functional mobility during ADLs: Min guard;Rolling walker (2 wheels);Cueing for sequencing;Cueing for safety General ADL Comments: Patient presenting with minimal back pain, and minimal need for increased assist with ADLs.     Vision Baseline Vision/History: 0 No visual deficits Ability to See in Adequate Light: 0 Adequate Patient Visual Report: No change from  baseline       Perception     Praxis      Pertinent Vitals/Pain Pain Assessment Pain Assessment: Faces Faces Pain Scale: Hurts a little bit Pain Location:  back Pain Descriptors / Indicators: Discomfort, Grimacing, Guarding Pain Intervention(s): Limited activity within patient's tolerance, Monitored during session, Repositioned     Hand Dominance Right   Extremity/Trunk Assessment Upper Extremity Assessment Upper Extremity Assessment: Overall WFL for tasks assessed   Lower Extremity Assessment Lower Extremity Assessment: Defer to PT evaluation   Cervical / Trunk Assessment Cervical / Trunk Assessment: Back Surgery   Communication Communication Communication: No difficulties   Cognition Arousal/Alertness: Awake/alert Behavior During Therapy: WFL for tasks assessed/performed Overall Cognitive Status: Within Functional Limits for tasks assessed                                       General Comments       Exercises     Shoulder Instructions      Home Living Family/patient expects to be discharged to:: Private residence Living Arrangements: Alone (Going to live with daughter then friend initially) Available Help at Discharge: Family;Available 24 hours/day Type of Home: House Home Access: Stairs to enter [employer] of Steps: 1   Home Layout: Full bath on main level;Able to live on main level with bedroom/bathroom;Two level     Bathroom Shower/Tub: Occupational psychologist: Standard (Has BSC to put over) Bathroom Accessibility: Yes   Home Equipment: Conservation officer, nature (2 wheels);BSC/3in1   Additional Comments: Putting BSC over top of toilet, also has toilet aide      Prior Functioning/Environment Prior Level of Function : Independent/Modified Independent;Driving                        OT Problem List: Decreased range of motion;Decreased activity tolerance;Impaired balance (sitting and/or standing);Pain      OT Treatment/Interventions: Self-care/ADL training;Patient/family education    OT Goals(Current goals can be found in the care plan section) Acute Rehab OT  Goals Patient Stated Goal: to get back to my independence OT Goal Formulation: With patient/family Time For Goal Achievement: 12/12/22 Potential to Achieve Goals: Good  OT Frequency: Min 2X/week    Co-evaluation              AM-PAC OT "6 Clicks" Daily Activity     Outcome Measure Help from another person eating meals?: A Little Help from another person taking care of personal grooming?: A Little Help from another person toileting, which includes using toliet, bedpan, or urinal?: A Little Help from another person bathing (including washing, rinsing, drying)?: A Little Help from another person to put on and taking off regular upper body clothing?: A Little Help from another person to put on and taking off regular lower body clothing?: A Little 6 Click Score: 18   End of Session Equipment Utilized During Treatment: Gait belt;Rolling walker (2 wheels) Nurse Communication: Mobility status  Activity Tolerance: Patient tolerated treatment well Patient left: Other (comment) (Handed off to PT)  OT Visit Diagnosis: Pain;Unsteadiness on feet (R26.81) Pain - part of body:  (Back)                Time: [id number] OT Time Calculation (min): 20 min Charges:  OT General Charges $OT Visit: 1 Visit OT Evaluation $OT Eval Moderate Complexity: 1 Mod  Corinne Ports E. Wasyl Dornfeld, OTR/L Acute  Rehabilitation Services [phone]   Ascencion Dike 11/28/2022, 2:51 PM

## 2022-11-28 NOTE — Evaluation (Signed)
Physical Therapy Evaluation and Discharge Patient Details Name: Michaela Morris MRN: [id number] DOB: 07-Nov-1945 Today's Date: 11/28/2022  History of Present Illness  Patient is a 77 yo female presenting to the hospital for L3-S1 fusion. PMH includes: Blood dyscrasia, GERD, HTN, hypothyroidism, and OA  Clinical Impression  Patient evaluated by Physical Therapy with no further acute PT needs identified. Pt verbalizing good pain control and is overall mobilizing well. Denies radicular pain or numbness/tingling. Ambulating 250 ft with a walker and negotiated 2 steps without physical assist. Education provided regarding spinal precautions, activity recommendations and progression and car transfer technique. All education has been completed and the patient has no further questions. No follow-up Physical Therapy or equipment needs. PT is signing off. Thank you for this referral.      Recommendations for follow up therapy are one component of a multi-disciplinary discharge planning process, led by the attending physician.  Recommendations may be updated based on patient status, additional functional criteria and insurance authorization.  Follow Up Recommendations No PT follow up      Assistance Recommended at Discharge PRN  Patient can return home with the following  Assistance with cooking/housework;Help with stairs or ramp for entrance;Assist for transportation    Equipment Recommendations None recommended by PT  Recommendations for Other Services       Functional Status Assessment Patient has had a recent decline in their functional status and demonstrates the ability to make significant improvements in function in a reasonable and predictable amount of time.     Precautions / Restrictions Precautions Precautions: Back Precaution Booklet Issued: No Precaution Comments: Able to recite without cues Restrictions Weight Bearing Restrictions: No      Mobility  Bed Mobility                General bed mobility comments: OOB with OT upon entrance; verbally reviewed bed mobility with pt/pt daughter. Pt daughter able to demonstrate correctly    Transfers Overall transfer level: Modified independent Equipment used: Rolling walker (2 wheels)                    Ambulation/Gait Ambulation/Gait assistance: Modified independent (Device/Increase time) Gait Distance (Feet): 250 Feet Assistive device: Rolling walker (2 wheels) Gait Pattern/deviations: Step-through pattern, Decreased stride length Gait velocity: decreased     General Gait Details: Steady pace, min cues for posture  Stairs Stairs: Yes Stairs assistance: Min guard Stair Management: Two rails Number of Stairs: 2 General stair comments: cues for step by step  Wheelchair Mobility    Modified Rankin (Stroke Patients Only)       Balance Overall balance assessment: Needs assistance Sitting-balance support: Feet supported Sitting balance-Leahy Scale: Good     Standing balance support: Bilateral upper extremity supported Standing balance-Leahy Scale: Fair                               Pertinent Vitals/Pain Pain Assessment Pain Assessment: Faces Faces Pain Scale: Hurts a little bit Pain Location: back Pain Descriptors / Indicators: Discomfort, Grimacing, Guarding Pain Intervention(s): Limited activity within patient's tolerance, Monitored during session, Premedicated before session    [place] expects to be discharged to:: Private residence Living Arrangements: Alone (Going to live with daughter then friend initially) Available Help at Discharge: Family;Available 24 hours/day Type of Home: House Home Access: Stairs to enter   [employer] of Steps: 1   Home Layout: Full bath on main level;Able to live  on main level with bedroom/bathroom;Two level Home Equipment: Conservation officer, nature (2 wheels);BSC/3in1 Additional Comments: Putting BSC over top of  toilet, also has toilet aide    Prior Function Prior Level of Function : Independent/Modified Independent;Driving                     Hand Dominance   Dominant Hand: Right    Extremity/Trunk Assessment   Upper Extremity Assessment Upper Extremity Assessment: Overall WFL for tasks assessed    Lower Extremity Assessment Lower Extremity Assessment: Overall WFL for tasks assessed    Cervical / Trunk Assessment Cervical / Trunk Assessment: Back Surgery  Communication   Communication: No difficulties  Cognition Arousal/Alertness: Awake/alert Behavior During Therapy: WFL for tasks assessed/performed Overall Cognitive Status: Within Functional Limits for tasks assessed                                          General Comments      Exercises     Assessment/Plan    PT Assessment Patient does not need any further PT services  PT Problem List Decreased activity tolerance;Decreased mobility;Pain       PT Treatment Interventions      PT Goals (Current goals can be found in the Care Plan section)  Acute Rehab PT Goals Patient Stated Goal: independence PT Goal Formulation: All assessment and education complete, DC therapy    Frequency       Co-evaluation               AM-PAC PT "6 Clicks" Mobility  Outcome Measure Help needed turning from your back to your side while in a flat bed without using bedrails?: None Help needed moving from lying on your back to sitting on the side of a flat bed without using bedrails?: A Little Help needed moving to and from a bed to a chair (including a wheelchair)?: None Help needed standing up from a chair using your arms (e.g., wheelchair or bedside chair)?: None Help needed to walk in hospital room?: None Help needed climbing 3-5 steps with a railing? : A Little 6 Click Score: 22    End of Session Equipment Utilized During Treatment: Gait belt Activity Tolerance: Patient tolerated treatment  well Patient left: in chair;with call bell/phone within reach;with chair alarm set;with family/visitor present Nurse Communication: Mobility status PT Visit Diagnosis: Pain;Difficulty in walking, not elsewhere classified (R26.2) Pain - part of body:  (back)    Time: [id number] PT Time Calculation (min) (ACUTE ONLY): 22 min   Charges:   PT Evaluation $PT Eval Low Complexity: 1 Low          Wyona Almas, PT, DPT Acute Rehabilitation Services Office [phone]   Deno Etienne 11/28/2022, 10:16 AM

## 2022-11-28 NOTE — Progress Notes (Signed)
Foley d/c'd at 0600.

## 2022-11-29 LAB — GLUCOSE, CAPILLARY
Glucose-Capillary: 160 mg/dL — ABNORMAL HIGH (ref 70–99)
Glucose-Capillary: 153 mg/dL — ABNORMAL HIGH (ref 70–99)

## 2022-11-29 MED ORDER — OXYCODONE HCL 10 MG PO TABS: 10.0000 mg | ORAL_TABLET | ORAL | 0 refills | Status: AC | PRN

## 2022-11-29 MED ORDER — COQ10 200 MG PO CAPS: 200.0000 mg | ORAL_CAPSULE | Freq: Every day | ORAL | 0 refills | Status: AC

## 2022-11-29 MED ORDER — ASPIRIN 81 MG PO TBEC: 81.0000 mg | DELAYED_RELEASE_TABLET | Freq: Every day | ORAL | 12 refills | Status: AC

## 2022-11-29 MED ORDER — METHOCARBAMOL 500 MG PO TABS: 500.0000 mg | ORAL_TABLET | Freq: Four times a day (QID) | ORAL | 2 refills | Status: AC | PRN

## 2022-11-29 MED ORDER — TURMERIC 500 MG PO CAPS: 2250.0000 mg | ORAL_CAPSULE | Freq: Every morning | ORAL | 0 refills | Status: AC

## 2022-11-29 MED ORDER — ELDERBERRY 575 MG/5ML PO SYRP: 3200.0000 mg | ORAL_SOLUTION | Freq: Every morning | ORAL | 0 refills | Status: AC

## 2022-11-29 NOTE — Discharge Summary (Addendum)
Physician Discharge Summary  Patient ID: Michaela Morris MRN: [id number] DOB/AGE: [birth year] 77 y.o.  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 female underwent elective L3-S1 decompression, TLIF and tolerated surgery well. Postop she progressed appropriately and had an uneventful course. Hmv was removed POD2, PT/OT recommended no followup. Her incision was c/d/I. Her pain was controlled on oral medication and she was tolerating a normal diet, having normal bowel and bladder function.   Treatments: Surgery - L3-S1 decompression and TLIF  Discharge Exam: Blood pressure (!) 110/51, pulse 71, temperature 99.2 F (37.3 C), temperature source Oral, resp. rate 18, height 5\' 2"  (1.575 m), weight 98.1 kg, SpO2 95 %. Awake, alert, orientedx3 PERRL Speech fluent, appropriate CN grossly intact 5/5 BUE/BLE Wound c/d/i  Disposition: Discharge disposition: 01-Home or Self Care       Discharge Instructions     Incentive spirometry RT   Complete by: As directed       Allergies as of 11/29/2022       Reactions   Bactrim [sulfamethoxazole-trimethoprim] Diarrhea, Nausea And Vomiting   Erythromycin Diarrhea, Nausea And Vomiting        Medication List     STOP taking these medications    celecoxib 200 MG capsule Commonly known as: CELEBREX       TAKE these medications    amLODipine 5 MG tablet Commonly known as: NORVASC Take 5 mg by mouth in the morning.   aspirin EC 81 MG tablet Take 1 tablet (81 mg total) by mouth at bedtime. Swallow whole. Start taking on: December 04, 2022 What changed: These instructions start on December 04, 2022. If you are unsure what to do  until then, ask your doctor or other care provider.   betamethasone dipropionate 0.05 % cream Apply 1 Application topically every other day. Vaginal area   CoQ10 200 MG Caps Take 200 mg by mouth at bedtime. Start taking on: December 04, 2022 What changed:  medication strength These instructions start on December 04, 2022. If you are unsure what to do until then, ask your doctor or other care provider.   desonide 0.05 % cream Commonly known as: DESOWEN Apply 1 application  topically daily as needed (irritation).   Elderberry 575 MG/5ML Syrp Take 3,200 mg by mouth in the morning. Start taking on: December 04, 2022 What changed:  medication strength These instructions start on December 04, 2022. If you are unsure what to do until then, ask your doctor or other care provider.   estradiol 1 MG tablet Commonly known as: ESTRACE Take 1 mg by mouth at bedtime.   fluticasone 50 MCG/ACT nasal spray Commonly known as: FLONASE Place 2 sprays into both nostrils daily as needed (seasonal allergies.).   losartan 100 MG tablet Commonly known as: COZAAR Take 100 mg by mouth in the morning.   metFORMIN 1000 MG tablet Commonly known as: GLUCOPHAGE Take 1,000 mg by mouth 2 (two) times daily.   methocarbamol 500 MG tablet Commonly known as: ROBAXIN Take 1 tablet (500 mg total) by mouth every 6 (six) hours as needed for muscle spasms.   MILK THISTLE PLUS PO Take 1 capsule by mouth in the morning.   montelukast 10 MG tablet Commonly known as: SINGULAIR Take 10 mg by mouth  at bedtime.   Oxycodone HCl 10 MG Tabs Take 1 tablet (10 mg total) by mouth every 4 (four) hours as needed for severe pain ((score 7 to 10)).   polyethylene glycol 17 g packet Commonly known as: MIRALAX / GLYCOLAX Take 17 g by mouth daily as needed (constipation.).   rosuvastatin 5 MG tablet Commonly known as: CRESTOR Take 5 mg by mouth at bedtime.   Synthroid 137 MCG tablet Generic drug: levothyroxine Take  137 mcg by mouth daily before breakfast.   Turmeric 500 MG Caps Take 2,250 mg by mouth in the morning. Qunol Turmeric Curcumin Start taking on: December 04, 2022 What changed:  medication strength These instructions start on December 04, 2022. If you are unsure what to do until then, ask your doctor or other care provider.   VITAMIN B12 SL Place 5,000 mcg under the tongue 2 (two) times a week. Sundays & Wednesdays in the morning.   Vitamin D3 125 MCG (5000 UT) Tabs Take 5,000 Units by mouth every evening.        Follow-up Information     Dyanna Seiter C, DO Follow up in 2 week(s).   Why: as scheduled Contact information: [address] [place] [address] [phone]                 Signed: Theodoro Doing Inioluwa Baris 11/29/2022, 10:33 AM

## 2022-11-29 NOTE — Plan of Care (Signed)
VeRN reviewed AVS with pt and her daughter. VeRN answered their questions and provided supportive education. They stated that they understood the discharge instructions and education. Problem: Education: Goal: Knowledge of General Education information will improve Description: Including pain rating scale, medication(s)/side effects and non-pharmacologic comfort measures Outcome: Adequate for Discharge   Problem: Health Behavior/Discharge Planning: Goal: Ability to manage health-related needs will improve Outcome: Adequate for Discharge   Problem: Clinical Measurements: Goal: Ability to maintain clinical measurements within normal limits will improve Outcome: Adequate for Discharge Goal: Will remain free from infection Outcome: Adequate for Discharge Goal: Diagnostic test results will improve Outcome: Adequate for Discharge Goal: Respiratory complications will improve Outcome: Adequate for Discharge Goal: Cardiovascular complication will be avoided Outcome: Adequate for Discharge   Problem: Activity: Goal: Risk for activity intolerance will decrease Outcome: Adequate for Discharge   Problem: Nutrition: Goal: Adequate nutrition will be maintained Outcome: Adequate for Discharge   Problem: Coping: Goal: Level of anxiety will decrease Outcome: Adequate for Discharge   Problem: Elimination: Goal: Will not experience complications related to bowel motility Outcome: Adequate for Discharge Goal: Will not experience complications related to urinary retention Outcome: Adequate for Discharge   Problem: Pain Managment: Goal: General experience of comfort will improve Outcome: Adequate for Discharge   Problem: Safety: Goal: Ability to remain free from injury will improve Outcome: Adequate for Discharge   Problem: Skin Integrity: Goal: Risk for impaired skin integrity will decrease Outcome: Adequate for Discharge   Problem: Education: Goal: Ability to verbalize activity  precautions or restrictions will improve Outcome: Adequate for Discharge Goal: Knowledge of the prescribed therapeutic regimen will improve Outcome: Adequate for Discharge Goal: Understanding of discharge needs will improve Outcome: Adequate for Discharge   Problem: Activity: Goal: Ability to avoid complications of mobility impairment will improve Outcome: Adequate for Discharge Goal: Ability to tolerate increased activity will improve Outcome: Adequate for Discharge Goal: Will remain free from falls Outcome: Adequate for Discharge   Problem: Bowel/Gastric: Goal: Gastrointestinal status for postoperative course will improve Outcome: Adequate for Discharge   Problem: Clinical Measurements: Goal: Ability to maintain clinical measurements within normal limits will improve Outcome: Adequate for Discharge Goal: Postoperative complications will be avoided or minimized Outcome: Adequate for Discharge Goal: Diagnostic test results will improve Outcome: Adequate for Discharge   Problem: Pain Management: Goal: Pain level will decrease Outcome: Adequate for Discharge   Problem: Skin Integrity: Goal: Will show signs of wound healing Outcome: Adequate for Discharge   Problem: Health Behavior/Discharge Planning: Goal: Identification of resources available to assist in meeting health care needs will improve Outcome: Adequate for Discharge   Problem: Bladder/Genitourinary: Goal: Urinary functional status for postoperative course will improve Outcome: Adequate for Discharge   Problem: Education: Goal: Ability to describe self-care measures that may prevent or decrease complications (Diabetes Survival Skills Education) will improve Outcome: Adequate for Discharge Goal: Individualized Educational Video(s) Outcome: Adequate for Discharge   Problem: Coping: Goal: Ability to adjust to condition or change in health will improve Outcome: Adequate for Discharge   Problem: Fluid  Volume: Goal: Ability to maintain a balanced intake and output will improve Outcome: Adequate for Discharge   Problem: Health Behavior/Discharge Planning: Goal: Ability to identify and utilize available resources and services will improve Outcome: Adequate for Discharge Goal: Ability to manage health-related needs will improve Outcome: Adequate for Discharge   Problem: Metabolic: Goal: Ability to maintain appropriate glucose levels will improve Outcome: Adequate for Discharge   Problem: Nutritional: Goal: Maintenance of adequate nutrition will improve  Outcome: Adequate for Discharge Goal: Progress toward achieving an optimal weight will improve Outcome: Adequate for Discharge   Problem: Skin Integrity: Goal: Risk for impaired skin integrity will decrease Outcome: Adequate for Discharge   Problem: Tissue Perfusion: Goal: Adequacy of tissue perfusion will improve Outcome: Adequate for Discharge   Problem: Education: Goal: Ability to verbalize activity precautions or restrictions will improve Outcome: Adequate for Discharge Goal: Knowledge of the prescribed therapeutic regimen will improve Outcome: Adequate for Discharge Goal: Understanding of discharge needs will improve Outcome: Adequate for Discharge   Problem: Activity: Goal: Ability to avoid complications of mobility impairment will improve Outcome: Adequate for Discharge Goal: Ability to tolerate increased activity will improve Outcome: Adequate for Discharge Goal: Will remain free from falls Outcome: Adequate for Discharge   Problem: Bowel/Gastric: Goal: Gastrointestinal status for postoperative course will improve Outcome: Adequate for Discharge   Problem: Clinical Measurements: Goal: Ability to maintain clinical measurements within normal limits will improve Outcome: Adequate for Discharge Goal: Postoperative complications will be avoided or minimized Outcome: Adequate for Discharge Goal: Diagnostic test  results will improve Outcome: Adequate for Discharge   Problem: Pain Management: Goal: Pain level will decrease Outcome: Adequate for Discharge   Problem: Skin Integrity: Goal: Will show signs of wound healing Outcome: Adequate for Discharge   Problem: Health Behavior/Discharge Planning: Goal: Identification of resources available to assist in meeting health care needs will improve Outcome: Adequate for Discharge   Problem: Bladder/Genitourinary: Goal: Urinary functional status for postoperative course will improve Outcome: Adequate for Discharge   Problem: Acute Rehab OT Goals (only OT should resolve) Goal: Pt. Will Perform Lower Body Bathing Outcome: Adequate for Discharge Goal: Pt. Will Perform Lower Body Dressing Outcome: Adequate for Discharge Goal: Pt. Will Transfer To Toilet Outcome: Adequate for Discharge Goal: Pt. Will Perform Toileting-Clothing Manipulation Outcome: Adequate for Discharge   Problem: Education: Goal: Knowledge of General Education information will improve Description: Including pain rating scale, medication(s)/side effects and non-pharmacologic comfort measures Outcome: Adequate for Discharge   Problem: Health Behavior/Discharge Planning: Goal: Ability to manage health-related needs will improve Outcome: Adequate for Discharge   Problem: Clinical Measurements: Goal: Ability to maintain clinical measurements within normal limits will improve Outcome: Adequate for Discharge Goal: Will remain free from infection Outcome: Adequate for Discharge Goal: Diagnostic test results will improve Outcome: Adequate for Discharge Goal: Respiratory complications will improve Outcome: Adequate for Discharge Goal: Cardiovascular complication will be avoided Outcome: Adequate for Discharge   Problem: Activity: Goal: Risk for activity intolerance will decrease Outcome: Adequate for Discharge   Problem: Nutrition: Goal: Adequate nutrition will be  maintained Outcome: Adequate for Discharge   Problem: Coping: Goal: Level of anxiety will decrease Outcome: Adequate for Discharge   Problem: Elimination: Goal: Will not experience complications related to bowel motility Outcome: Adequate for Discharge Goal: Will not experience complications related to urinary retention Outcome: Adequate for Discharge   Problem: Pain Managment: Goal: General experience of comfort will improve Outcome: Adequate for Discharge   Problem: Safety: Goal: Ability to remain free from injury will improve Outcome: Adequate for Discharge   Problem: Skin Integrity: Goal: Risk for impaired skin integrity will decrease Outcome: Adequate for Discharge   Problem: Education: Goal: Ability to verbalize activity precautions or restrictions will improve Outcome: Adequate for Discharge Goal: Knowledge of the prescribed therapeutic regimen will improve Outcome: Adequate for Discharge Goal: Understanding of discharge needs will improve Outcome: Adequate for Discharge   Problem: Activity: Goal: Ability to avoid complications of mobility impairment will improve Outcome:  Adequate for Discharge Goal: Ability to tolerate increased activity will improve Outcome: Adequate for Discharge Goal: Will remain free from falls Outcome: Adequate for Discharge   Problem: Bowel/Gastric: Goal: Gastrointestinal status for postoperative course will improve Outcome: Adequate for Discharge   Problem: Clinical Measurements: Goal: Ability to maintain clinical measurements within normal limits will improve Outcome: Adequate for Discharge Goal: Postoperative complications will be avoided or minimized Outcome: Adequate for Discharge Goal: Diagnostic test results will improve Outcome: Adequate for Discharge   Problem: Pain Management: Goal: Pain level will decrease Outcome: Adequate for Discharge   Problem: Skin Integrity: Goal: Will show signs of wound healing Outcome:  Adequate for Discharge   Problem: Health Behavior/Discharge Planning: Goal: Identification of resources available to assist in meeting health care needs will improve Outcome: Adequate for Discharge   Problem: Bladder/Genitourinary: Goal: Urinary functional status for postoperative course will improve Outcome: Adequate for Discharge   Problem: Education: Goal: Ability to describe self-care measures that may prevent or decrease complications (Diabetes Survival Skills Education) will improve Outcome: Adequate for Discharge Goal: Individualized Educational Video(s) Outcome: Adequate for Discharge   Problem: Coping: Goal: Ability to adjust to condition or change in health will improve Outcome: Adequate for Discharge   Problem: Fluid Volume: Goal: Ability to maintain a balanced intake and output will improve Outcome: Adequate for Discharge   Problem: Health Behavior/Discharge Planning: Goal: Ability to identify and utilize available resources and services will improve Outcome: Adequate for Discharge Goal: Ability to manage health-related needs will improve Outcome: Adequate for Discharge   Problem: Metabolic: Goal: Ability to maintain appropriate glucose levels will improve Outcome: Adequate for Discharge   Problem: Nutritional: Goal: Maintenance of adequate nutrition will improve Outcome: Adequate for Discharge Goal: Progress toward achieving an optimal weight will improve Outcome: Adequate for Discharge   Problem: Skin Integrity: Goal: Risk for impaired skin integrity will decrease Outcome: Adequate for Discharge   Problem: Tissue Perfusion: Goal: Adequacy of tissue perfusion will improve Outcome: Adequate for Discharge   Problem: Education: Goal: Ability to verbalize activity precautions or restrictions will improve Outcome: Adequate for Discharge Goal: Knowledge of the prescribed therapeutic regimen will improve Outcome: Adequate for Discharge Goal: Understanding of  discharge needs will improve Outcome: Adequate for Discharge   Problem: Activity: Goal: Ability to avoid complications of mobility impairment will improve Outcome: Adequate for Discharge Goal: Ability to tolerate increased activity will improve Outcome: Adequate for Discharge Goal: Will remain free from falls Outcome: Adequate for Discharge   Problem: Bowel/Gastric: Goal: Gastrointestinal status for postoperative course will improve Outcome: Adequate for Discharge   Problem: Clinical Measurements: Goal: Ability to maintain clinical measurements within normal limits will improve Outcome: Adequate for Discharge Goal: Postoperative complications will be avoided or minimized Outcome: Adequate for Discharge Goal: Diagnostic test results will improve Outcome: Adequate for Discharge   Problem: Pain Management: Goal: Pain level will decrease Outcome: Adequate for Discharge   Problem: Skin Integrity: Goal: Will show signs of wound healing Outcome: Adequate for Discharge   Problem: Health Behavior/Discharge Planning: Goal: Identification of resources available to assist in meeting health care needs will improve Outcome: Adequate for Discharge   Problem: Bladder/Genitourinary: Goal: Urinary functional status for postoperative course will improve Outcome: Adequate for Discharge   Problem: Acute Rehab OT Goals (only OT should resolve) Goal: Pt. Will Perform Lower Body Bathing Outcome: Adequate for Discharge Goal: Pt. Will Perform Lower Body Dressing Outcome: Adequate for Discharge Goal: Pt. Will Transfer To Toilet Outcome: Adequate for Discharge Goal: Pt. Will Perform  Toileting-Clothing Manipulation Outcome: Adequate for Discharge

## 2022-11-29 NOTE — Progress Notes (Signed)
Occupational Therapy Treatment Patient Details Name: Michaela Morris MRN: [id number] DOB: Mar 04, 1946 Today's Date: 11/29/2022   History of present illness Patient is a 77 yo female presenting to the hospital for L3-S1 fusion. PMH includes: Blood dyscrasia, GERD, HTN, hypothyroidism, and OA   OT comments  Pt reports increased back soreness today though moving BLE with improved ease during session. Focus on AE education for LB ADLs, bed mobility and compensatory strategies for IADLs. Pt actively involved in discussion, verbalized understanding and handouts provided for improved carryover. DC recs remain appropriate and pt eager to DC home today.   Recommendations for follow up therapy are one component of a multi-disciplinary discharge planning process, led by the attending physician.  Recommendations may be updated based on patient status, additional functional criteria and insurance authorization.    Follow Up Recommendations  No OT follow up     Assistance Recommended at Discharge PRN  Patient can return home with the following  A little help with bathing/dressing/bathroom;Assistance with cooking/housework;Assist for transportation   Equipment Recommendations  None recommended by OT    Recommendations for Other Services      Precautions / Restrictions Precautions Precautions: Back Precaution Booklet Issued: Yes (comment) Precaution Comments: no brace needed Restrictions Weight Bearing Restrictions: No       Mobility Bed Mobility Overal bed mobility: Modified Independent             General bed mobility comments: able to return demo log rolling. pt was unsure she would be able to do task but reports surprised herself today. discussed bedrail attachments online if needed    Transfers Overall transfer level: Modified independent Equipment used: Rolling walker (2 wheels) Transfers: Sit to/from Stand, Bed to chair/wheelchair/BSC Sit to Stand: Modified independent  (Device/Increase time)     Step pivot transfers: Modified independent (Device/Increase time)     General transfer comment: from recliner, to/from bed for log rolling practice     Balance Overall balance assessment: Needs assistance Sitting-balance support: Feet supported Sitting balance-Leahy Scale: Good     Standing balance support: Bilateral upper extremity supported Standing balance-Leahy Scale: Fair                             ADL either performed or assessed with clinical judgement   ADL Overall ADL's : Modified independent                     Lower Body Dressing: Modified independent;Sit to/from stand                 General ADL Comments: educated on AE options for LB dressing though pt typically wears slip on shoes, verbally discussed mgmt of pants with and without AE - pt denied concerns w/ this. Facilitated discussions on compensatory strategies for feeding dog, turning off lamp at night, toileting hygiene and bed mobility. Pt has already been watching youtube videos on strategies s/p back sx    Extremity/Trunk Assessment Upper Extremity Assessment Upper Extremity Assessment: Overall WFL for tasks assessed   Lower Extremity Assessment Lower Extremity Assessment: Defer to PT evaluation        Vision   Vision Assessment?: No apparent visual deficits   Perception     Praxis      Cognition Arousal/Alertness: Awake/alert Behavior During Therapy: WFL for tasks assessed/performed Overall Cognitive Status: Within Functional Limits for tasks assessed  Exercises      Shoulder Instructions       General Comments      Pertinent Vitals/ Pain       Pain Assessment Pain Assessment: Faces Faces Pain Scale: Hurts a little bit Pain Location: back Pain Descriptors / Indicators: Discomfort, Grimacing, Guarding Pain Intervention(s): Monitored during session, Premedicated before  session  Home Living                                          Prior Functioning/Environment              Frequency  Min 2X/week        Progress Toward Goals  OT Goals(current goals can now be found in the care plan section)  Progress towards OT goals: Progressing toward goals  Acute Rehab OT Goals Patient Stated Goal: home today, regain independence OT Goal Formulation: With patient/family Time For Goal Achievement: 12/12/22 Potential to Achieve Goals: Good ADL Goals Pt Will Perform Lower Body Bathing: Independently;sit to/from stand;sitting/lateral leans Pt Will Perform Lower Body Dressing: Independently;sit to/from stand;sitting/lateral leans;with adaptive equipment Pt Will Transfer to Toilet: Independently;ambulating;regular height toilet;grab bars Pt Will Perform Toileting - Clothing Manipulation and hygiene: Independently;sitting/lateral leans;sit to/from stand  Plan Discharge plan remains appropriate    Co-evaluation                 AM-PAC OT "6 Clicks" Daily Activity     Outcome Measure   Help from another person eating meals?: None Help from another person taking care of personal grooming?: None Help from another person toileting, which includes using toliet, bedpan, or urinal?: A Little Help from another person bathing (including washing, rinsing, drying)?: A Little Help from another person to put on and taking off regular upper body clothing?: None Help from another person to put on and taking off regular lower body clothing?: None 6 Click Score: 22    End of Session Equipment Utilized During Treatment: Rolling walker (2 wheels)  OT Visit Diagnosis: Pain;Unsteadiness on feet (R26.81)   Activity Tolerance Patient tolerated treatment well   Patient Left in chair;with call bell/phone within reach;Other (comment) (chair alarm was not connected on entry)   Nurse Communication          Time: [phone] OT Time Calculation  (min): 33 min  Charges: OT General Charges $OT Visit: 1 Visit OT Treatments $Self Care/Home Management : 23-37 mins  Malachy Chamber, OTR/L Acute Rehab Services Office: [phone]   Layla Maw 11/29/2022, 8:05 AM

## 2022-11-30 ENCOUNTER — Telehealth: Payer: Self-pay

## 2022-11-30 NOTE — Telephone Encounter (Signed)
Daughter asked if bandage can be removed- advised that can be removed just to keep site clean and dry and covered. Advised t notify Dr. Reatha Armour if having any redness, swelling, drainage or increased pain to area.
